# Patient Record
Sex: Female | Born: 1940
Health system: Southern US, Community
[De-identification: ages and names within clinical notes are randomized; demographics above are authoritative.]

## PROBLEM LIST (undated history)

## (undated) DIAGNOSIS — K219 Gastro-esophageal reflux disease without esophagitis: Secondary | ICD-10-CM

## (undated) DIAGNOSIS — I10 Essential (primary) hypertension: Secondary | ICD-10-CM

---

## 2007-11-29 ENCOUNTER — Emergency Department: Payer: Self-pay | Admitting: Emergency Medicine

## 2008-05-07 ENCOUNTER — Ambulatory Visit: Payer: Self-pay | Admitting: Internal Medicine

## 2009-04-02 ENCOUNTER — Emergency Department: Payer: Self-pay

## 2009-06-08 ENCOUNTER — Emergency Department: Payer: Self-pay | Admitting: Emergency Medicine

## 2009-08-20 ENCOUNTER — Emergency Department: Payer: Self-pay | Admitting: Emergency Medicine

## 2009-11-02 ENCOUNTER — Emergency Department: Payer: Self-pay | Admitting: Emergency Medicine

## 2010-12-01 ENCOUNTER — Emergency Department: Payer: Self-pay | Admitting: Emergency Medicine

## 2011-03-19 ENCOUNTER — Ambulatory Visit: Payer: Self-pay | Admitting: Urology

## 2012-02-26 IMAGING — CR LEFT WRIST - COMPLETE 3+ VIEW
1 series · 4 of 4 positions shown · non-contrast
Comparison: none

REASON FOR EXAM: fall, pain
COMMENTS:

PROCEDURE:     DXR - DXR WRIST LT COMP WITH OBLIQUES  - December 01, 2010  [DATE]
RESULT:     There is no evidence of fracture, dislocation, or malalignment.

[Series 1: view not recorded · 0.17mm/px · 4 of 4 slices shown]
[im 1/4]
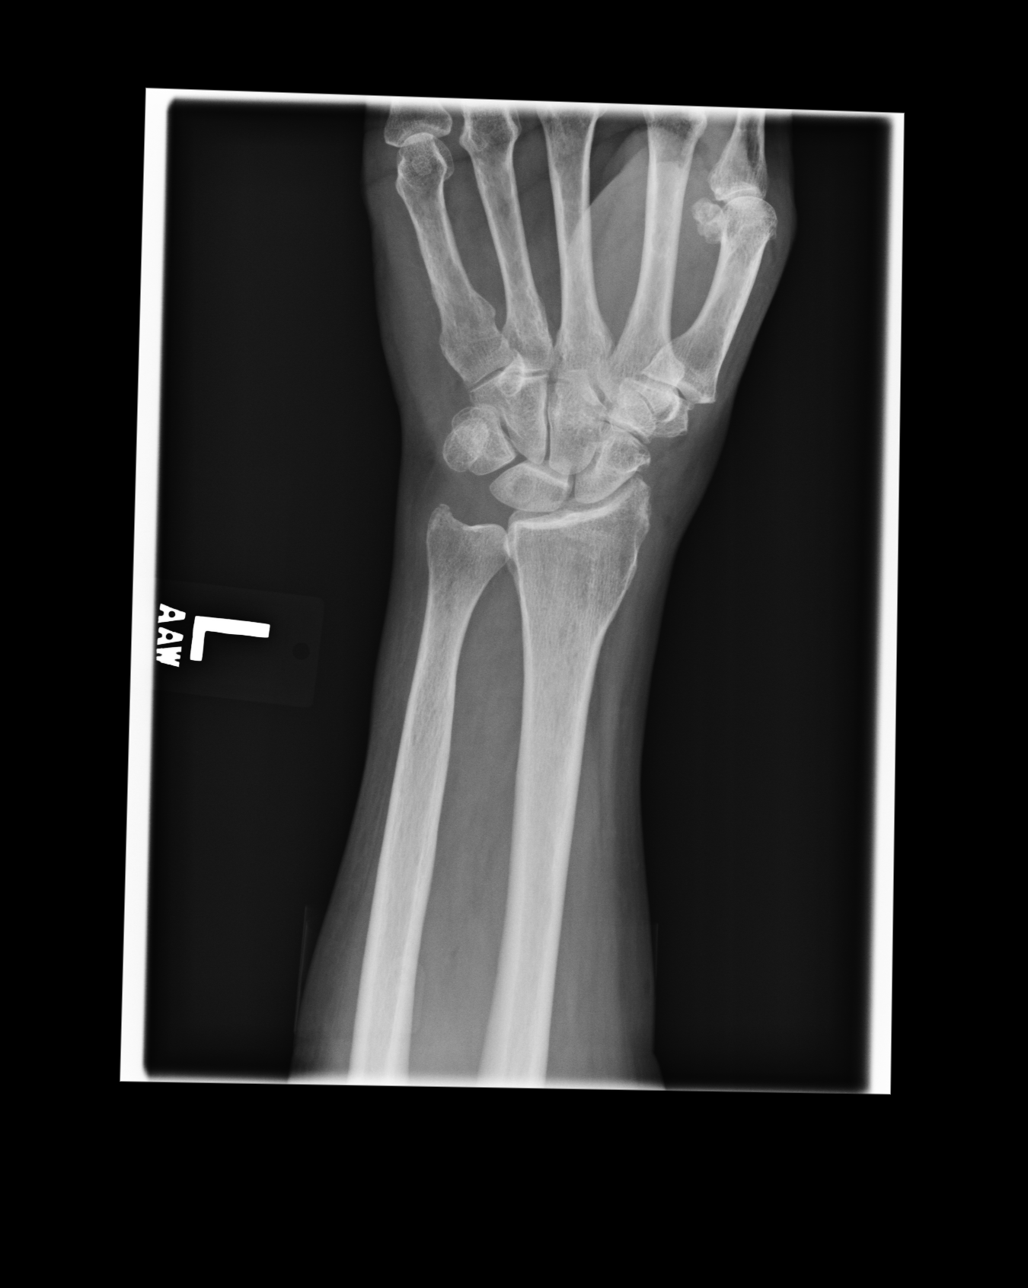
[im 2/4]
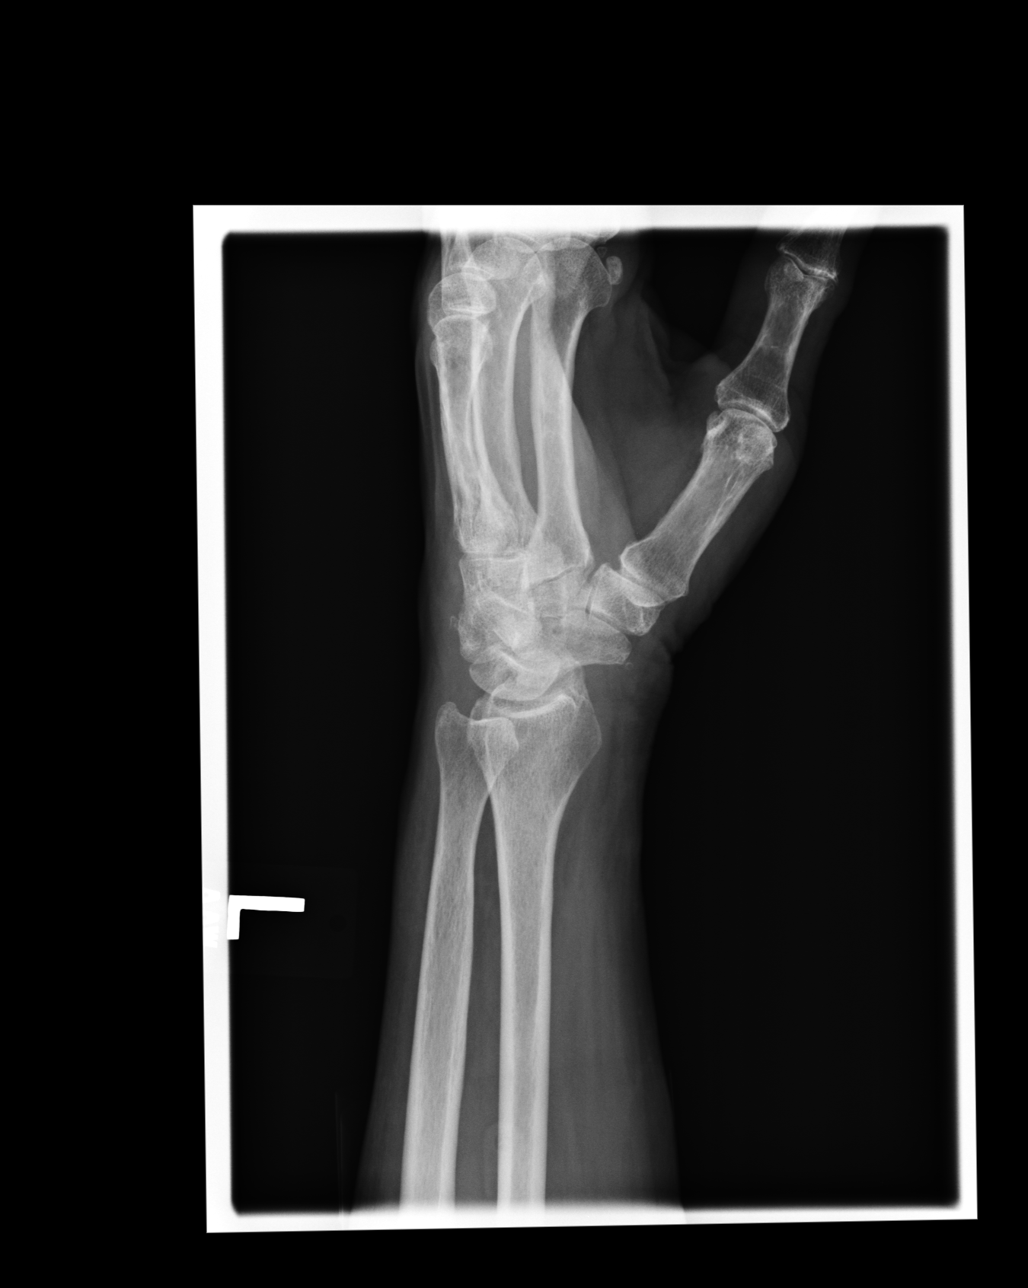
[im 3/4]
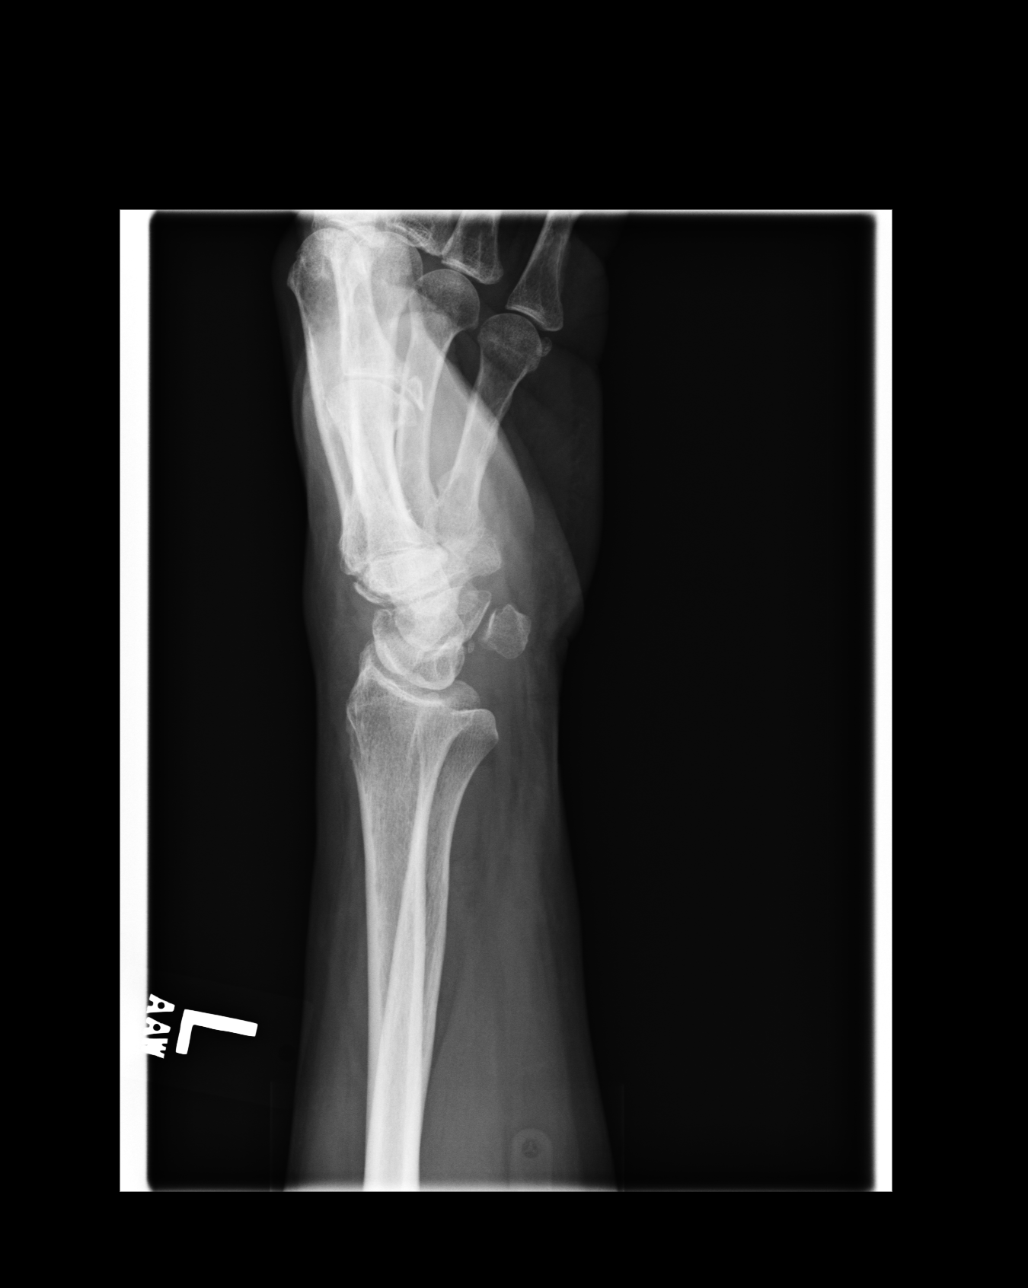
[im 4/4]
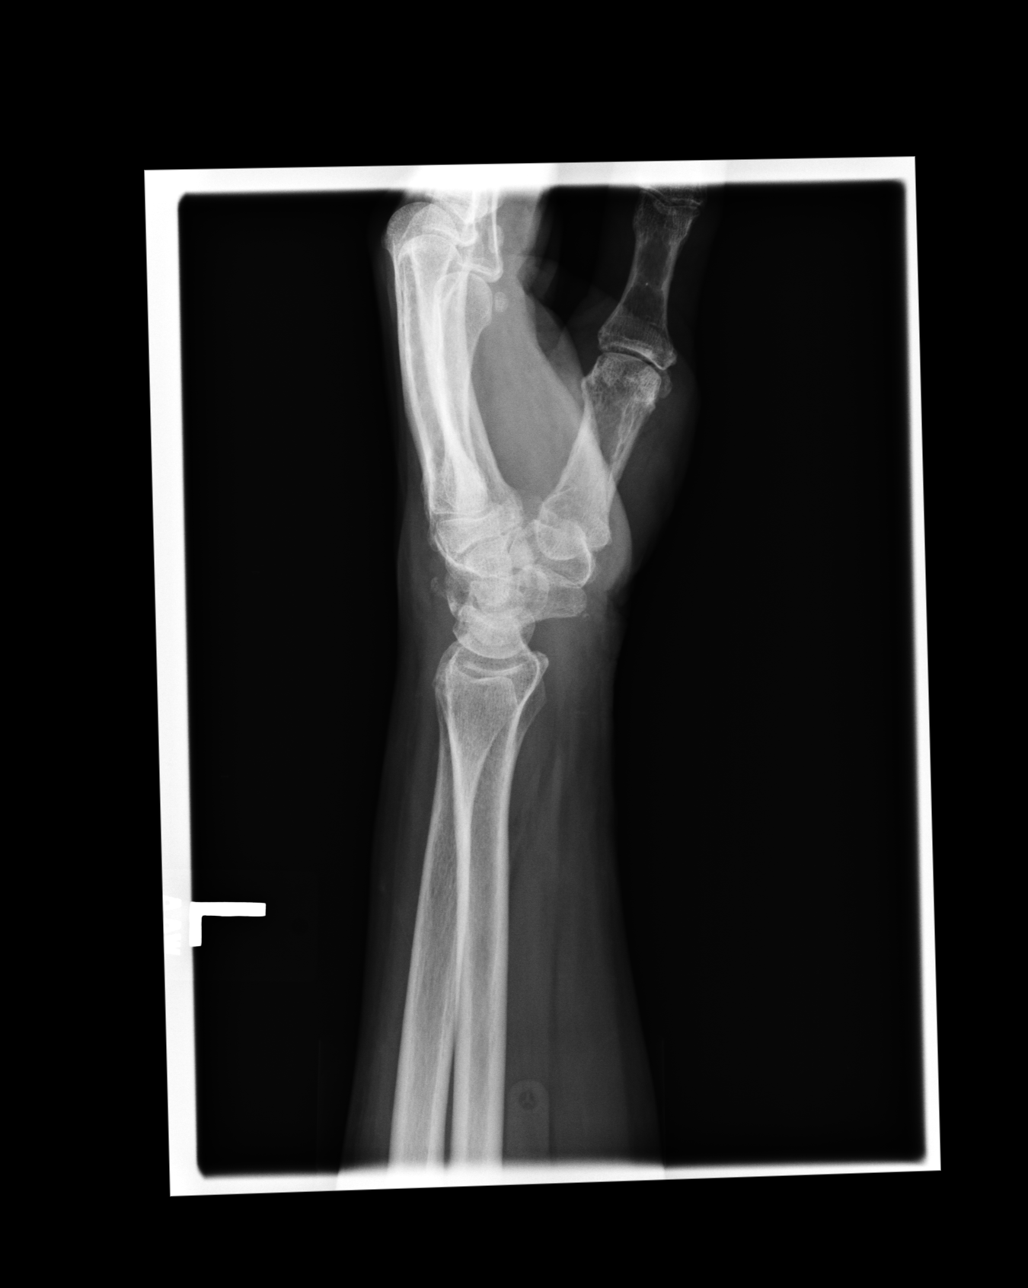

[4 of 4 positions shown; findings below may reference images not displayed]

IMPRESSION: 1. No evidence of acute abnormalities.
2. If there are persistent complaints of pain or persistent clinical
concern, a repeat evaluation in 7-10 days is recommended if clinically
warranted.

## 2013-01-17 ENCOUNTER — Ambulatory Visit: Payer: Self-pay | Admitting: Nurse Practitioner

## 2014-11-12 ENCOUNTER — Other Ambulatory Visit: Payer: Self-pay | Admitting: Nurse Practitioner

## 2014-11-12 DIAGNOSIS — Z1231 Encounter for screening mammogram for malignant neoplasm of breast: Secondary | ICD-10-CM

## 2014-11-25 ENCOUNTER — Ambulatory Visit
Admission: RE | Admit: 2014-11-25 | Discharge: 2014-11-25 | Disposition: A | Discharge status: Home / Self Care | Payer: Medicare Other | Source: Ambulatory Visit | Attending: Nurse Practitioner | Admitting: Nurse Practitioner

## 2014-11-25 ENCOUNTER — Other Ambulatory Visit: Payer: Self-pay | Admitting: Nurse Practitioner

## 2014-11-25 DIAGNOSIS — Z1231 Encounter for screening mammogram for malignant neoplasm of breast: Secondary | ICD-10-CM | POA: Diagnosis not present

## 2016-03-18 ENCOUNTER — Other Ambulatory Visit: Payer: Self-pay | Admitting: Nurse Practitioner

## 2016-03-18 DIAGNOSIS — Z1231 Encounter for screening mammogram for malignant neoplasm of breast: Secondary | ICD-10-CM

## 2016-04-16 ENCOUNTER — Ambulatory Visit: Payer: Medicare Other

## 2016-06-10 ENCOUNTER — Ambulatory Visit
Admission: RE | Admit: 2016-06-10 | Discharge: 2016-06-10 | Disposition: A | Discharge status: Home / Self Care | Payer: Medicare HMO | Source: Ambulatory Visit | Attending: Nurse Practitioner | Admitting: Nurse Practitioner

## 2016-06-10 DIAGNOSIS — Z1231 Encounter for screening mammogram for malignant neoplasm of breast: Secondary | ICD-10-CM | POA: Diagnosis present

## 2017-04-20 ENCOUNTER — Other Ambulatory Visit: Payer: Self-pay | Admitting: Nurse Practitioner

## 2017-04-20 DIAGNOSIS — Z1231 Encounter for screening mammogram for malignant neoplasm of breast: Secondary | ICD-10-CM

## 2017-07-29 ENCOUNTER — Ambulatory Visit
Admission: RE | Admit: 2017-07-29 | Discharge: 2017-07-29 | Disposition: A | Discharge status: Home / Self Care | Payer: Medicare HMO | Source: Ambulatory Visit | Attending: Nurse Practitioner | Admitting: Nurse Practitioner

## 2017-07-29 DIAGNOSIS — Z1231 Encounter for screening mammogram for malignant neoplasm of breast: Secondary | ICD-10-CM | POA: Diagnosis not present

## 2017-09-30 ENCOUNTER — Emergency Department
Admission: EM | Admit: 2017-09-30 | Discharge: 2017-09-30 | Disposition: A | Discharge status: Home / Self Care | Payer: Medicare HMO | Attending: Emergency Medicine | Admitting: Emergency Medicine

## 2017-09-30 ENCOUNTER — Emergency Department: Payer: Medicare HMO

## 2017-09-30 DIAGNOSIS — W1831XA Fall on same level due to stepping on an object, initial encounter: Secondary | ICD-10-CM | POA: Diagnosis not present

## 2017-09-30 DIAGNOSIS — W19XXXA Unspecified fall, initial encounter: Secondary | ICD-10-CM

## 2017-09-30 DIAGNOSIS — I10 Essential (primary) hypertension: Secondary | ICD-10-CM | POA: Insufficient documentation

## 2017-09-30 DIAGNOSIS — Y999 Unspecified external cause status: Secondary | ICD-10-CM | POA: Diagnosis not present

## 2017-09-30 DIAGNOSIS — Z87891 Personal history of nicotine dependence: Secondary | ICD-10-CM | POA: Insufficient documentation

## 2017-09-30 DIAGNOSIS — Y929 Unspecified place or not applicable: Secondary | ICD-10-CM | POA: Insufficient documentation

## 2017-09-30 DIAGNOSIS — M25512 Pain in left shoulder: Secondary | ICD-10-CM | POA: Insufficient documentation

## 2017-09-30 DIAGNOSIS — Y939 Activity, unspecified: Secondary | ICD-10-CM | POA: Insufficient documentation

## 2017-09-30 HISTORY — DX: Essential (primary) hypertension: I10

## 2017-09-30 HISTORY — DX: Gastro-esophageal reflux disease without esophagitis: K21.9

## 2017-09-30 MED ORDER — HYDROCODONE-ACETAMINOPHEN 5-325 MG PO TABS
1.0000 | ORAL_TABLET | Freq: Once | ORAL | Status: AC
  Administered 2017-09-30: 1 via ORAL
  Filled 2017-09-30: qty 1

## 2017-09-30 MED ORDER — HYDROCODONE-ACETAMINOPHEN 5-325 MG PO TABS: 1.0000 | ORAL_TABLET | Freq: Four times a day (QID) | ORAL | 0 refills | Status: DC | PRN

## 2017-09-30 NOTE — ED Provider Notes (Signed)
[facility] Emergency Department Provider Note  ____________________________________________   First MD Initiated Contact with Patient 09/30/17 1645     (approximate)  I have reviewed the triage vital signs and the nursing notes.   HISTORY  Chief Complaint Fall and Shoulder Injury    HPI [name] [name] is a 77 y.o. female complains of falling on her left shoulder earlier today.  She states that she cannot lift her left arm overhead.  States pain is a 10 out of 10.  She has not hit her head.  She did not lose consciousness.  She has had no neurologic deficits since the fall at 1 PM.  She denies any numbness or tingling   Past Medical History:  Diagnosis Date  . GERD (gastroesophageal reflux disease)   . Hypertension     There are no active problems to display for this patient.   History reviewed. No pertinent surgical history.  Prior to Admission medications   Medication Sig Start Date End Date Taking? Authorizing Provider  HYDROcodone-acetaminophen (NORCO/VICODIN) 5-325 MG tablet Take 1 tablet by mouth every 6 (six) hours as needed for moderate pain. 09/30/17   Faythe GheeFisher, [employer] W, PA-C    Allergies Patient has no known allergies.  Family History  Problem Relation Age of Onset  . Breast cancer Neg Hx     Social History Social History   Tobacco Use  . Smoking status: Former Games developermoker  . Smokeless tobacco: Never Used  Substance Use Topics  . Alcohol use: Yes    Comment: occ  . Drug use: Not Currently    Review of Systems   Constitutional: No fever/chills Eyes: No visual changes. ENT: No sore throat. Respiratory: Denies cough Genitourinary: Negative for dysuria. Musculoskeletal: Negative for back pain.  Positive for left shoulder pain Skin: Negative for rash.    ____________________________________________   PHYSICAL EXAM:  VITAL SIGNS: ED Triage Vitals  Enc Vitals Group     BP 09/30/17 1615 (!) 118/96     Pulse Rate  09/30/17 1615 92     Resp 09/30/17 1615 18     Temp 09/30/17 1615 98.3 F (36.8 C)     Temp Source 09/30/17 1615 Oral     SpO2 09/30/17 1615 97 %     Weight 09/30/17 1618 170 lb (77.1 kg)     Height --      Head Circumference --      Peak Flow --      Pain Score 09/30/17 1617 10     Pain Loc --      Pain Edu? --      Excl. in GC? --     Constitutional: Alert and oriented. Well appearing and in no acute distress. Eyes: Conjunctivae are normal.  Head: Atraumatic. Nose: No congestion/rhinnorhea. Mouth/Throat: Mucous membranes are moist.   Cardiovascular: Normal rate, regular rhythm.  Sounds are normal Respiratory: Normal respiratory effort.  No retractions, lungs clear to auscultation GU: deferred Musculoskeletal: The left shoulder is tender and swollen at the proximal humerus and distal clavicle.  The pain is reproduced with movement of the left arm.  She is neurovascularly intact.  C-spine is nontender.  Wrist and elbows are nontender. Neurologic:  Normal speech and language.  Skin:  Skin is warm, dry and intact. No rash noted. Psychiatric: Mood and affect are normal. Speech and behavior are normal.  ____________________________________________   LABS (all labs ordered are listed, but only abnormal results are displayed)  Labs Reviewed - No  data to display ____________________________________________   ____________________________________________  RADIOLOGY  X-ray of the left shoulder is negative for any acute fractures  ____________________________________________   PROCEDURES  Procedure(s) performed: Sling was applied by the tech  Procedures    ____________________________________________   INITIAL IMPRESSION / ASSESSMENT AND PLAN / ED COURSE  Pertinent labs & imaging results that were available during my care of the patient were reviewed by me and considered in my medical decision making (see chart for details).  Patient is a 77 year old female that  fell on her left shoulder earlier today.  She is complaining pain in the left shoulder.  No loss consciousness.  No headache.  On physical exam left shoulder is tender and swollen.  X-ray left shoulder is negative for any acute fracture  A sling was applied by the tech.  X-ray results were discussed with patient and her family.  She was given a prescription for Norco 5/325 #15 no refill.  She is to follow-up with her regular doctor who she Artie has an appointment with on Monday.  If she is worsening she should return the emergency department.  She has any headaches or neurologic changes she should return to the emergency department as soon as possible.  Patient and her family both state they understand.  She was discharged in stable condition     As part of my medical decision making, I reviewed the following data within the electronic MEDICAL RECORD NUMBER History obtained from family, Nursing notes reviewed and incorporated, Radiograph reviewed x-ray of the left shoulder is negative, Notes from prior ED visits and [place] Controlled Substance Database  ____________________________________________   FINAL CLINICAL IMPRESSION(S) / ED DIAGNOSES  Final diagnoses:  Fall, initial encounter  Acute pain of left shoulder      NEW MEDICATIONS STARTED DURING THIS VISIT:  Discharge Medication List as of 09/30/2017  5:17 PM    START taking these medications   Details  HYDROcodone-acetaminophen (NORCO/VICODIN) 5-325 MG tablet Take 1 tablet by mouth every 6 (six) hours as needed for moderate pain., Starting Fri 09/30/2017, Print         Note:  This document was prepared using Dragon voice recognition software and may include unintentional dictation errors.    Faythe Ghee, PA-C 09/30/17 1828    Jeanmarie Plant, MD 09/30/17 [phone]

## 2017-09-30 NOTE — ED Triage Notes (Signed)
Pt reports babysitting grandchild and she fell over toy and fell on L shoulder.  Pt states that this happened at about 130pm.  Pt is A&Ox4.  Pt denies taking anything for pain PTA.

## 2017-09-30 NOTE — ED Notes (Signed)
Pt reports tripping over a toy earlier today and landing on her left shoulder. Pt does not having any mobility in her left arm and states her pain is a 10 on 1-10 scale.

## 2017-09-30 NOTE — Discharge Instructions (Signed)
Follow-up with your regular doctor if you are not better in 3-5 days.  He can also see orthopedics with Dr.Poggi.  He would need to call and make an appointment in his office.  Apply ice to the affected area.  He can take over-the-counter Tylenol or ibuprofen as needed.  He had a prescription for Norco which will help with pain not relieved by the over-the-counter medication.

## 2018-12-26 IMAGING — CR DG SHOULDER 2+V*L*
3 series · 3 of 3 positions shown · non-contrast
Comparison: None.

CLINICAL DATA: Fall, pain.

EXAM:
LEFT SHOULDER - 2+ VIEW

[shoulder grashey]
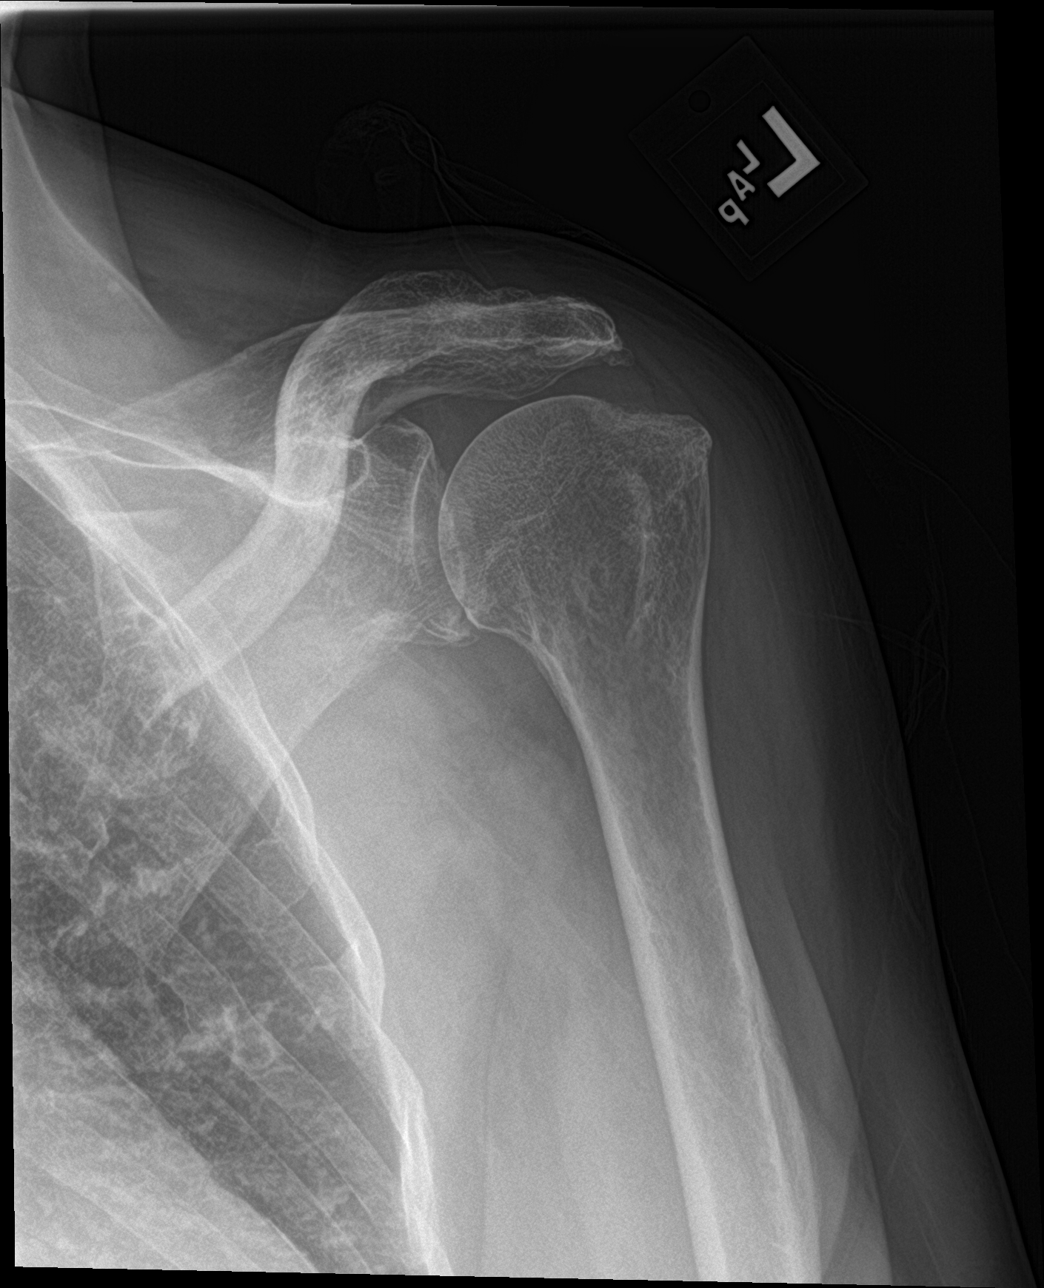

[shoulder y view]
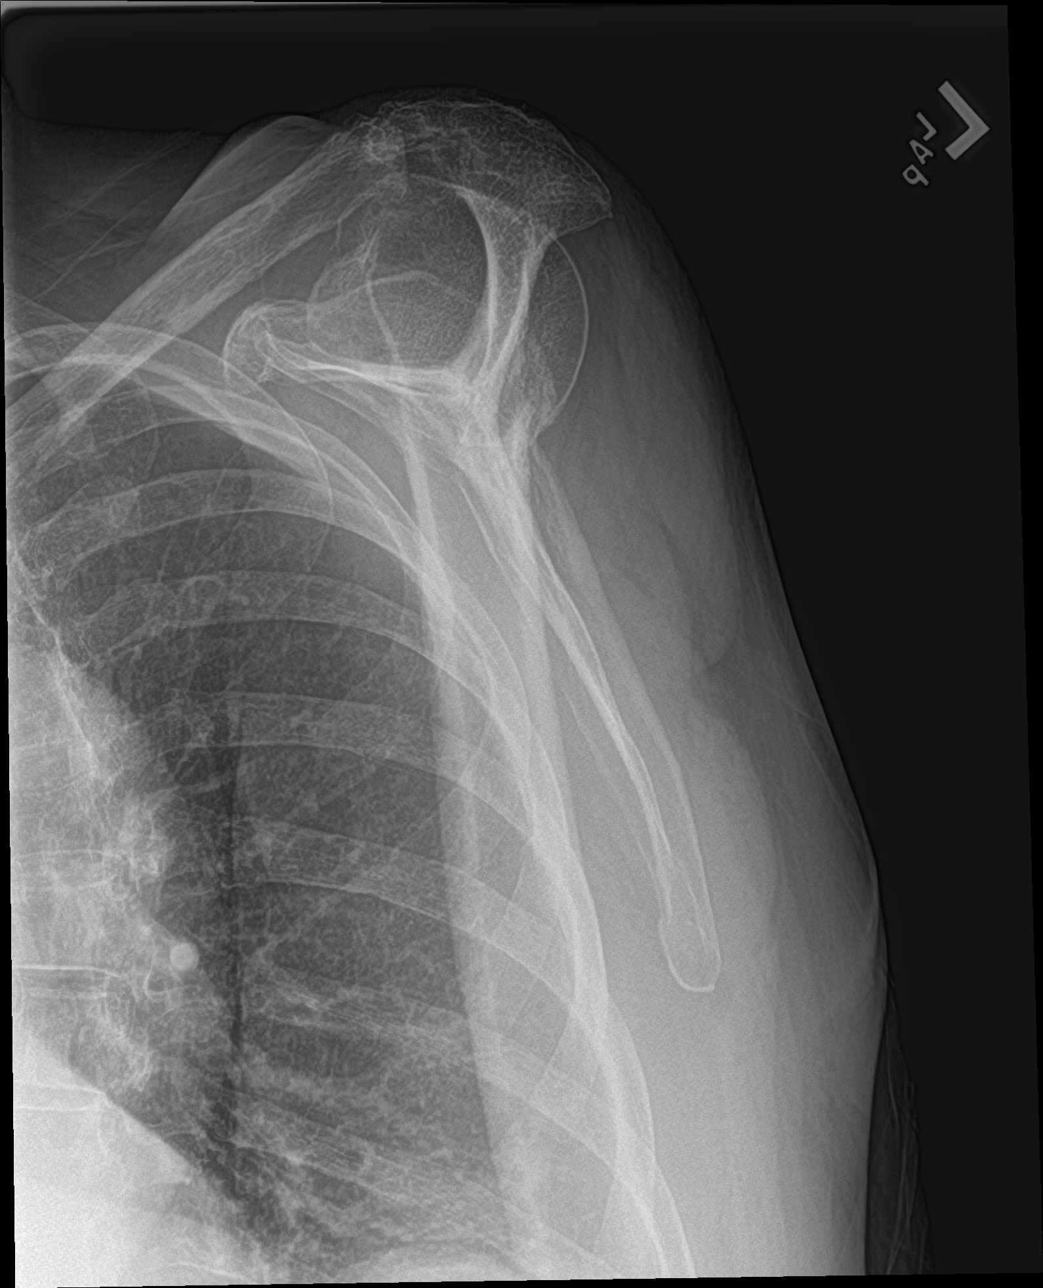

[shoulder axillary]
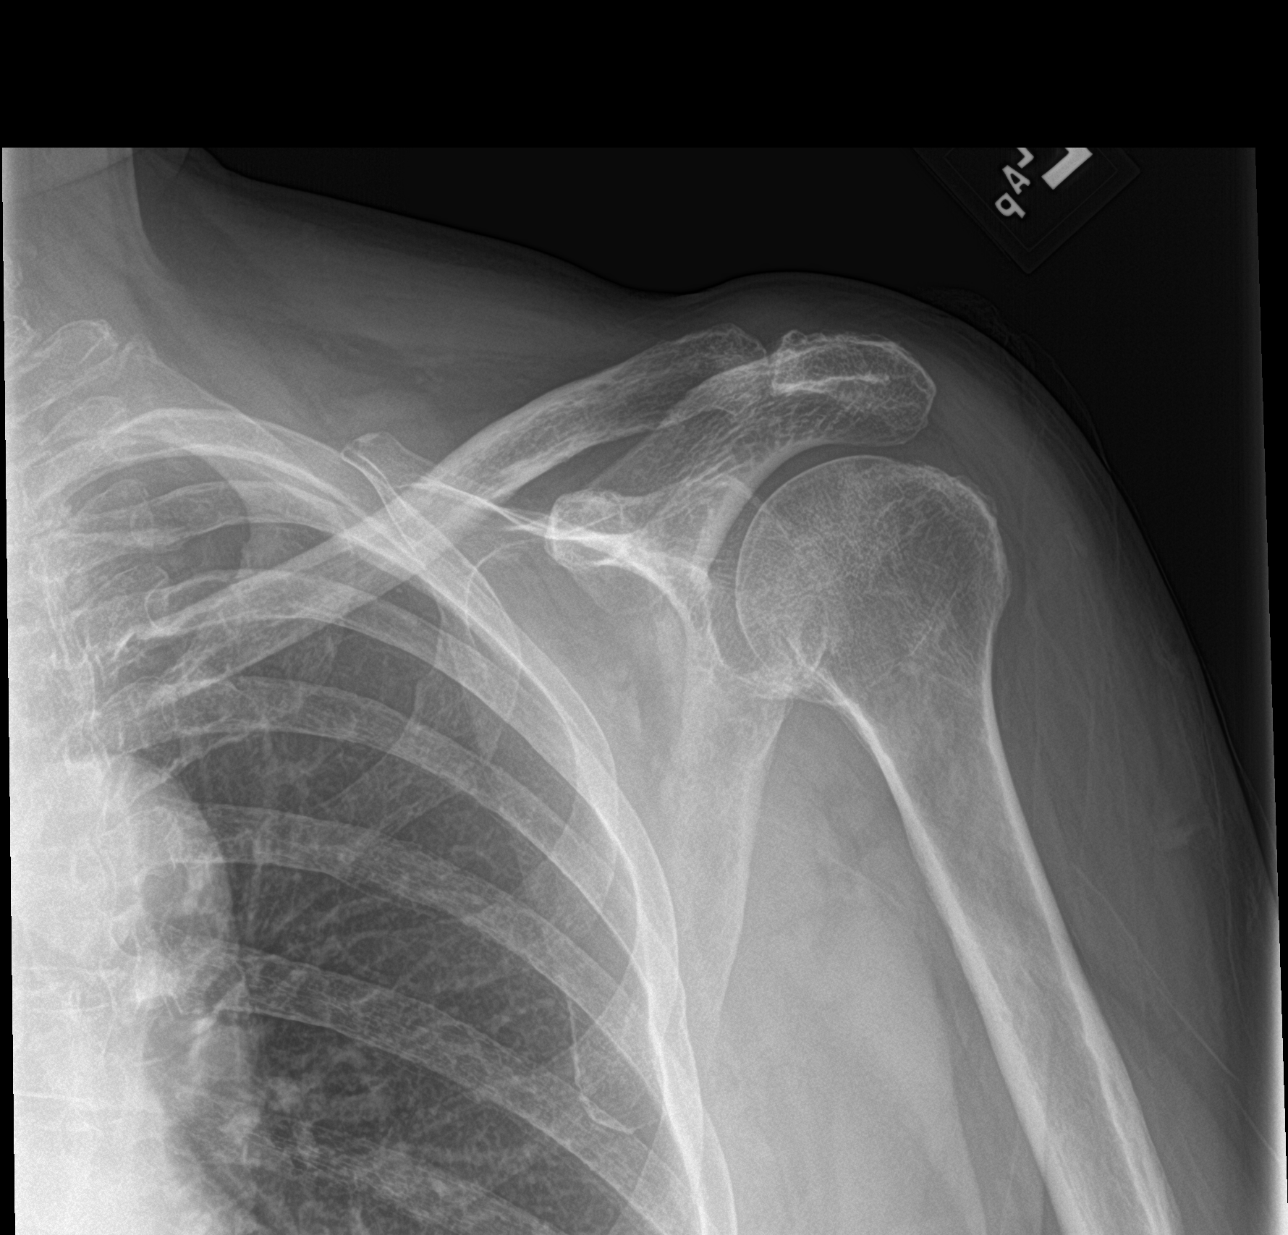

[3 of 3 positions shown; findings below may reference images not displayed]

FINDINGS: The patient could not extend the arm, therefore an axillary view was
not obtained. There is no evidence of fracture or dislocation. There
is no evidence of arthropathy or other focal bone abnormality. Soft
tissues are unremarkable.
IMPRESSION: Negative.

## 2018-12-27 ENCOUNTER — Other Ambulatory Visit: Payer: Self-pay | Admitting: Nurse Practitioner

## 2018-12-27 DIAGNOSIS — Z1231 Encounter for screening mammogram for malignant neoplasm of breast: Secondary | ICD-10-CM

## 2020-08-26 DIAGNOSIS — I10 Essential (primary) hypertension: Secondary | ICD-10-CM | POA: Diagnosis not present

## 2020-08-26 DIAGNOSIS — R5383 Other fatigue: Secondary | ICD-10-CM | POA: Diagnosis not present

## 2020-08-26 DIAGNOSIS — K219 Gastro-esophageal reflux disease without esophagitis: Secondary | ICD-10-CM | POA: Diagnosis not present

## 2020-08-26 DIAGNOSIS — E785 Hyperlipidemia, unspecified: Secondary | ICD-10-CM | POA: Diagnosis not present

## 2020-08-26 DIAGNOSIS — R7301 Impaired fasting glucose: Secondary | ICD-10-CM | POA: Diagnosis not present

## 2020-08-26 DIAGNOSIS — H6123 Impacted cerumen, bilateral: Secondary | ICD-10-CM | POA: Diagnosis not present

## 2020-08-28 DIAGNOSIS — R945 Abnormal results of liver function studies: Secondary | ICD-10-CM | POA: Diagnosis not present

## 2020-09-03 DIAGNOSIS — R945 Abnormal results of liver function studies: Secondary | ICD-10-CM | POA: Diagnosis not present

## 2020-10-28 ENCOUNTER — Ambulatory Visit: Payer: Medicare HMO | Admitting: Podiatry

## 2020-10-28 ENCOUNTER — Other Ambulatory Visit: Payer: Self-pay

## 2020-10-28 ENCOUNTER — Encounter: Payer: Self-pay | Admitting: Podiatry

## 2020-10-28 DIAGNOSIS — B353 Tinea pedis: Secondary | ICD-10-CM | POA: Diagnosis not present

## 2020-10-28 MED ORDER — CLOTRIMAZOLE-BETAMETHASONE 1-0.05 % EX CREA: 1.0000 "application " | TOPICAL_CREAM | Freq: Two times a day (BID) | CUTANEOUS | 0 refills | Status: DC

## 2020-10-28 NOTE — Progress Notes (Signed)
  Subjective:  Patient ID: [name] [name], female    DOB: 1940-07-04,  MRN: [id number]  Chief Complaint  Patient presents with  . Nail Problem    Patient presents today for ingrown toenail, Willetts thick nail hard to clip right hallux and left itchy feet    80 y.o. female presents with the above complaint.  Patient presents with complaint of left foot itching that has been going on for quite some time.  Patient states she had to do a lot of itching.  She goes barefooted everywhere.  She may have stepped in something that could have led to a fungal infection.  She would like to discuss treatment options for it she is got mild epidermal lysis.  She states is feeling little bit better now.  She has not tried any treatment options for it.  She denies any other acute complaints.   Review of Systems: Negative except as noted in the HPI. Denies N/V/F/Ch.  Past Medical History:  Diagnosis Date  . GERD (gastroesophageal reflux disease)   . Hypertension     Current Outpatient Medications:  .  Cholecalciferol (VITAMIN D3) 25 MCG (1000 UT) CAPS, Take by mouth., Disp: , Rfl:  .  clotrimazole-betamethasone (LOTRISONE) cream, Apply 1 application topically 2 (two) times daily., Disp: 30 g, Rfl: 0 .  amLODipine (NORVASC) 5 MG tablet, Take 1 tablet by mouth daily., Disp: , Rfl:  .  chlorthalidone (HYGROTON) 25 MG tablet, Take 25 mg by mouth daily., Disp: , Rfl:  .  HYDROcodone-acetaminophen (NORCO/VICODIN) 5-325 MG tablet, Take 1 tablet by mouth every 6 (six) hours as needed for moderate pain., Disp: 15 tablet, Rfl: 0 .  KLOR-CON M20 20 MEQ tablet, Take 20 mEq by mouth daily., Disp: , Rfl:  .  NAMZARIC 28-10 MG CP24, Take 1 capsule by mouth daily., Disp: , Rfl:  .  omeprazole (PRILOSEC) 40 MG capsule, Take 1 capsule by mouth daily., Disp: , Rfl:   Social History   Tobacco Use  Smoking Status Former Smoker  Smokeless Tobacco Never Used    No Known Allergies Objective:  There were no vitals filed for  this visit. There is no height or weight on file to calculate BMI. Constitutional Well developed. Well nourished.  Vascular Dorsalis pedis pulses palpable bilaterally. Posterior tibial pulses palpable bilaterally. Capillary refill normal to all digits.  No cyanosis or clubbing noted. Pedal hair growth normal.  Neurologic Normal speech. Oriented to person, place, and time. Epicritic sensation to light touch grossly present bilaterally.  Dermatologic  thickened elongated dystrophic toenails x10.  Mild pain on palpation.  Mycotic nature to it.  Left plantar foot with epidermal lysis and subjective complaint Hx consistent with athlete's foot.  Orthopedic: Normal joint ROM without pain or crepitus bilaterally. No visible deformities. No bony tenderness.   Radiographs: None Assessment:   1. Athlete's foot, left    Plan:  Patient was evaluated and treated and all questions answered.  Left foot athlete's foot -I explained to the patient the etiology of athlete's foot and various treatment options were discussed.  Given the amount of itching that she is experiencing I believe she will benefit from Lotrisone cream.  At this time her itching has calmed down.  She does have mild epidermal lysis still left.  I have asked her to apply the Lotrisone cream twice a day.  She states understanding will do so immediately.  Return in about 3 months (around 01/28/2021) for Routine Foot Care.

## 2020-11-13 ENCOUNTER — Other Ambulatory Visit: Payer: Self-pay | Admitting: Podiatry

## 2020-12-03 DIAGNOSIS — I10 Essential (primary) hypertension: Secondary | ICD-10-CM | POA: Diagnosis not present

## 2020-12-03 DIAGNOSIS — K219 Gastro-esophageal reflux disease without esophagitis: Secondary | ICD-10-CM | POA: Diagnosis not present

## 2020-12-03 DIAGNOSIS — R7303 Prediabetes: Secondary | ICD-10-CM | POA: Diagnosis not present

## 2020-12-03 DIAGNOSIS — R5383 Other fatigue: Secondary | ICD-10-CM | POA: Diagnosis not present

## 2020-12-03 DIAGNOSIS — R7301 Impaired fasting glucose: Secondary | ICD-10-CM | POA: Diagnosis not present

## 2020-12-03 DIAGNOSIS — R69 Illness, unspecified: Secondary | ICD-10-CM | POA: Diagnosis not present

## 2020-12-03 DIAGNOSIS — E785 Hyperlipidemia, unspecified: Secondary | ICD-10-CM | POA: Diagnosis not present

## 2020-12-17 DIAGNOSIS — R69 Illness, unspecified: Secondary | ICD-10-CM | POA: Diagnosis not present

## 2020-12-17 DIAGNOSIS — Z87891 Personal history of nicotine dependence: Secondary | ICD-10-CM | POA: Diagnosis not present

## 2020-12-17 DIAGNOSIS — K219 Gastro-esophageal reflux disease without esophagitis: Secondary | ICD-10-CM | POA: Diagnosis not present

## 2020-12-17 DIAGNOSIS — I10 Essential (primary) hypertension: Secondary | ICD-10-CM | POA: Diagnosis not present

## 2020-12-17 DIAGNOSIS — R32 Unspecified urinary incontinence: Secondary | ICD-10-CM | POA: Diagnosis not present

## 2021-01-13 ENCOUNTER — Encounter: Payer: Self-pay | Admitting: Podiatry

## 2021-01-13 ENCOUNTER — Other Ambulatory Visit: Payer: Self-pay

## 2021-01-13 ENCOUNTER — Ambulatory Visit (INDEPENDENT_AMBULATORY_CARE_PROVIDER_SITE_OTHER): Payer: Medicare HMO | Admitting: Podiatry

## 2021-01-13 DIAGNOSIS — B353 Tinea pedis: Secondary | ICD-10-CM | POA: Diagnosis not present

## 2021-01-13 MED ORDER — CLOTRIMAZOLE-BETAMETHASONE 1-0.05 % EX CREA: 1.0000 "application " | TOPICAL_CREAM | Freq: Two times a day (BID) | CUTANEOUS | 5 refills | Status: DC

## 2021-01-13 NOTE — Progress Notes (Signed)
ot

## 2021-01-13 NOTE — Progress Notes (Signed)
Subjective:  Patient ID: [name] [name], female    DOB: 12-06-1940,  MRN: [id number]  Chief Complaint  Patient presents with   Foot Pain    Bilateral itching and burning and soreness of both feet     80 y.o. female presents with the above complaint.  Patient presents with complaint of left athlete's foot which has now spread to the right side.  Patient states that she does go barefooted.  She has tried doing more shoes now.  She has been applying Lotrisone cream which seem to help considerably.  She however scratches her left side with the right foot which may have caused the scarring.  I encouraged her to stop doing that.  She denies any other acute complaint she would like to do a refill of Lotrisone cream.   Review of Systems: Negative except as noted in the HPI. Denies N/V/F/Ch.  Past Medical History:  Diagnosis Date   GERD (gastroesophageal reflux disease)    Hypertension     Current Outpatient Medications:    clotrimazole-betamethasone (LOTRISONE) cream, Apply 1 application topically 2 (two) times daily., Disp: 45 g, Rfl: 5   amLODipine (NORVASC) 5 MG tablet, Take 1 tablet by mouth daily., Disp: , Rfl:    chlorthalidone (HYGROTON) 25 MG tablet, Take 25 mg by mouth daily., Disp: , Rfl:    Cholecalciferol (VITAMIN D3) 25 MCG (1000 UT) CAPS, Take by mouth., Disp: , Rfl:    clotrimazole-betamethasone (LOTRISONE) cream, APPLY TOPICALLY TO THE AFFECTED AREA TWICE DAILY, Disp: 30 g, Rfl: 0   HYDROcodone-acetaminophen (NORCO/VICODIN) 5-325 MG tablet, Take 1 tablet by mouth every 6 (six) hours as needed for moderate pain., Disp: 15 tablet, Rfl: 0   KLOR-CON M20 20 MEQ tablet, Take 20 mEq by mouth daily., Disp: , Rfl:    NAMZARIC 28-10 MG CP24, Take 1 capsule by mouth daily., Disp: , Rfl:    omeprazole (PRILOSEC) 40 MG capsule, Take 1 capsule by mouth daily., Disp: , Rfl:   Social History   Tobacco Use  Smoking Status Former  Smokeless Tobacco Never    No Known Allergies Objective:   There were no vitals filed for this visit. There is no height or weight on file to calculate BMI. Constitutional Well developed. Well nourished.  Vascular Dorsalis pedis pulses palpable bilaterally. Posterior tibial pulses palpable bilaterally. Capillary refill normal to all digits.  No cyanosis or clubbing noted. Pedal hair growth normal.  Neurologic Normal speech. Oriented to person, place, and time. Epicritic sensation to light touch grossly present bilaterally.  Dermatologic  thickened elongated dystrophic toenails x10.  Mild pain on palpation.  Mycotic nature to it.  Improving left plantar foot with epidermal lysis and subjective complaint Hx consistent with athlete's foot.  Same findings on the right side.  Orthopedic: Normal joint ROM without pain or crepitus bilaterally. No visible deformities. No bony tenderness.   Radiographs: None Assessment:   1. Tinea pedis of both feet     Plan:  Patient was evaluated and treated and all questions answered.  Bilateral foot athlete's foot -I explained to the patient the etiology of athlete's foot and various treatment options were discussed.  Given the amount of itching that she is experiencing I believe she will benefit from Lotrisone cream.  At this time her itching has calmed down. -Refill for Lotrisone cream has been sent for both feet now.  I encouraged her to apply twice a day.  She states understanding will do so immediately.  No follow-ups on file.

## 2021-01-29 ENCOUNTER — Ambulatory Visit: Payer: Medicare HMO | Admitting: Podiatry

## 2021-02-05 DIAGNOSIS — E785 Hyperlipidemia, unspecified: Secondary | ICD-10-CM | POA: Diagnosis not present

## 2021-02-05 DIAGNOSIS — M1990 Unspecified osteoarthritis, unspecified site: Secondary | ICD-10-CM | POA: Diagnosis not present

## 2021-02-05 DIAGNOSIS — I10 Essential (primary) hypertension: Secondary | ICD-10-CM | POA: Diagnosis not present

## 2021-02-05 DIAGNOSIS — M25562 Pain in left knee: Secondary | ICD-10-CM | POA: Diagnosis not present

## 2021-02-16 DIAGNOSIS — R051 Acute cough: Secondary | ICD-10-CM | POA: Diagnosis not present

## 2021-02-16 DIAGNOSIS — J189 Pneumonia, unspecified organism: Secondary | ICD-10-CM | POA: Diagnosis not present

## 2021-02-16 DIAGNOSIS — R42 Dizziness and giddiness: Secondary | ICD-10-CM | POA: Diagnosis not present

## 2021-02-18 DIAGNOSIS — J159 Unspecified bacterial pneumonia: Secondary | ICD-10-CM | POA: Diagnosis not present

## 2021-02-18 DIAGNOSIS — I10 Essential (primary) hypertension: Secondary | ICD-10-CM | POA: Diagnosis not present

## 2021-02-18 DIAGNOSIS — R69 Illness, unspecified: Secondary | ICD-10-CM | POA: Diagnosis not present

## 2021-02-18 DIAGNOSIS — M1990 Unspecified osteoarthritis, unspecified site: Secondary | ICD-10-CM | POA: Diagnosis not present

## 2021-02-26 DIAGNOSIS — R7301 Impaired fasting glucose: Secondary | ICD-10-CM | POA: Diagnosis not present

## 2021-02-26 DIAGNOSIS — I10 Essential (primary) hypertension: Secondary | ICD-10-CM | POA: Diagnosis not present

## 2021-02-26 DIAGNOSIS — J159 Unspecified bacterial pneumonia: Secondary | ICD-10-CM | POA: Diagnosis not present

## 2021-02-26 DIAGNOSIS — E785 Hyperlipidemia, unspecified: Secondary | ICD-10-CM | POA: Diagnosis not present

## 2021-02-26 DIAGNOSIS — M1990 Unspecified osteoarthritis, unspecified site: Secondary | ICD-10-CM | POA: Diagnosis not present

## 2021-02-26 DIAGNOSIS — R69 Illness, unspecified: Secondary | ICD-10-CM | POA: Diagnosis not present

## 2021-03-16 ENCOUNTER — Ambulatory Visit: Payer: Medicare HMO | Admitting: Podiatry

## 2021-03-19 ENCOUNTER — Ambulatory Visit: Payer: Medicare HMO | Admitting: Podiatry

## 2021-03-19 DIAGNOSIS — R7303 Prediabetes: Secondary | ICD-10-CM | POA: Diagnosis not present

## 2021-03-19 DIAGNOSIS — E785 Hyperlipidemia, unspecified: Secondary | ICD-10-CM | POA: Diagnosis not present

## 2021-03-19 DIAGNOSIS — M25551 Pain in right hip: Secondary | ICD-10-CM | POA: Diagnosis not present

## 2021-03-19 DIAGNOSIS — H6123 Impacted cerumen, bilateral: Secondary | ICD-10-CM | POA: Diagnosis not present

## 2021-03-19 DIAGNOSIS — Z23 Encounter for immunization: Secondary | ICD-10-CM | POA: Diagnosis not present

## 2021-03-24 DIAGNOSIS — M25551 Pain in right hip: Secondary | ICD-10-CM | POA: Diagnosis not present

## 2021-07-07 DIAGNOSIS — M1712 Unilateral primary osteoarthritis, left knee: Secondary | ICD-10-CM | POA: Diagnosis not present

## 2021-07-07 DIAGNOSIS — E785 Hyperlipidemia, unspecified: Secondary | ICD-10-CM | POA: Diagnosis not present

## 2021-07-07 DIAGNOSIS — I10 Essential (primary) hypertension: Secondary | ICD-10-CM | POA: Diagnosis not present

## 2021-07-07 DIAGNOSIS — G621 Alcoholic polyneuropathy: Secondary | ICD-10-CM | POA: Diagnosis not present

## 2021-07-07 DIAGNOSIS — R7303 Prediabetes: Secondary | ICD-10-CM | POA: Diagnosis not present

## 2021-07-15 DIAGNOSIS — E876 Hypokalemia: Secondary | ICD-10-CM | POA: Diagnosis not present

## 2021-07-17 DIAGNOSIS — M25562 Pain in left knee: Secondary | ICD-10-CM | POA: Diagnosis not present

## 2021-07-17 DIAGNOSIS — M1712 Unilateral primary osteoarthritis, left knee: Secondary | ICD-10-CM | POA: Diagnosis not present

## 2021-07-17 DIAGNOSIS — M25462 Effusion, left knee: Secondary | ICD-10-CM | POA: Diagnosis not present

## 2021-10-19 DIAGNOSIS — E785 Hyperlipidemia, unspecified: Secondary | ICD-10-CM | POA: Diagnosis not present

## 2021-10-19 DIAGNOSIS — M25512 Pain in left shoulder: Secondary | ICD-10-CM | POA: Diagnosis not present

## 2021-10-19 DIAGNOSIS — F0390 Unspecified dementia without behavioral disturbance: Secondary | ICD-10-CM | POA: Diagnosis not present

## 2021-10-19 DIAGNOSIS — I10 Essential (primary) hypertension: Secondary | ICD-10-CM | POA: Diagnosis not present

## 2021-10-19 DIAGNOSIS — R7303 Prediabetes: Secondary | ICD-10-CM | POA: Diagnosis not present

## 2021-11-18 DIAGNOSIS — M1712 Unilateral primary osteoarthritis, left knee: Secondary | ICD-10-CM | POA: Diagnosis not present

## 2021-12-31 ENCOUNTER — Telehealth: Payer: Self-pay | Admitting: *Deleted

## 2022-02-09 DIAGNOSIS — R7303 Prediabetes: Secondary | ICD-10-CM | POA: Diagnosis not present

## 2022-02-09 DIAGNOSIS — M25512 Pain in left shoulder: Secondary | ICD-10-CM | POA: Diagnosis not present

## 2022-02-09 DIAGNOSIS — I10 Essential (primary) hypertension: Secondary | ICD-10-CM | POA: Diagnosis not present

## 2022-02-09 DIAGNOSIS — E1165 Type 2 diabetes mellitus with hyperglycemia: Secondary | ICD-10-CM | POA: Diagnosis not present

## 2022-02-09 DIAGNOSIS — E785 Hyperlipidemia, unspecified: Secondary | ICD-10-CM | POA: Diagnosis not present

## 2022-03-03 DIAGNOSIS — M1712 Unilateral primary osteoarthritis, left knee: Secondary | ICD-10-CM | POA: Diagnosis not present

## 2022-03-20 DIAGNOSIS — E785 Hyperlipidemia, unspecified: Secondary | ICD-10-CM | POA: Diagnosis not present

## 2022-03-20 DIAGNOSIS — I10 Essential (primary) hypertension: Secondary | ICD-10-CM | POA: Diagnosis not present

## 2022-03-26 NOTE — Telephone Encounter (Signed)
error 

## 2022-04-05 DIAGNOSIS — M1712 Unilateral primary osteoarthritis, left knee: Secondary | ICD-10-CM | POA: Diagnosis not present

## 2022-05-26 DIAGNOSIS — R5383 Other fatigue: Secondary | ICD-10-CM | POA: Diagnosis not present

## 2022-05-26 DIAGNOSIS — Z0001 Encounter for general adult medical examination with abnormal findings: Secondary | ICD-10-CM | POA: Diagnosis not present

## 2022-05-26 DIAGNOSIS — R7303 Prediabetes: Secondary | ICD-10-CM | POA: Diagnosis not present

## 2022-05-26 DIAGNOSIS — E1165 Type 2 diabetes mellitus with hyperglycemia: Secondary | ICD-10-CM | POA: Diagnosis not present

## 2022-05-26 DIAGNOSIS — I10 Essential (primary) hypertension: Secondary | ICD-10-CM | POA: Diagnosis not present

## 2022-05-26 DIAGNOSIS — E785 Hyperlipidemia, unspecified: Secondary | ICD-10-CM | POA: Diagnosis not present

## 2022-06-19 DIAGNOSIS — E785 Hyperlipidemia, unspecified: Secondary | ICD-10-CM | POA: Diagnosis not present

## 2022-06-19 DIAGNOSIS — I10 Essential (primary) hypertension: Secondary | ICD-10-CM | POA: Diagnosis not present

## 2022-08-25 ENCOUNTER — Telehealth: Payer: Self-pay

## 2022-08-25 NOTE — Telephone Encounter (Signed)
HTN Review Call  BP #1 reading (last): 120/80 on: 05/26/2022 BP #2 reading: 112/70 on: 02/09/2022 BP #3 reading: 134/68 on: 10/19/2021 Any of the last 3 BP > 140/90 mmHg?: No What recent interventions have been made by any provider to improve the patient's conditions in the last 3 months?: Office Visit: 06/25/22 (Not A Actual Visit) [facility] ANP-C STARTED Tamiflu 75 mg 1 capsule by mouth twice daily x 5 days Has there been any documented recent hospitalizations or ED visits since last visit with Clinical Lead?: No Adherence Review Does the [facility] have access to medication refill data?: Yes Adherence rates for STAR metric medications: Metformin 1500 mg - 06/08/22 90 DS Simvastatin 20 mg - 05/11/22 90 DS - Patient stated her pharmacy keeps sending it so early. Adherence rates for medications indicated for disease state being reviewed: None. Does the patient have >5 day gap between last estimated fill dates for any of the above medications?: No Disease State Questions Able to connect with the Patient?: Yes Is the patient monitoring his/her BP?: Yes How often are you checking your BP?: occasionally Home BP Reading #1 (most recent): Patient stated her daughter does it every evening but she cant remember what it was  Is the patient having any low BP Readings <90/60?: No Is the patient having any BP readings above >180/100?: No Is the patient's average BP>140/90?: No What is your blood pressure goal?: 120/80 Educate patient to inform proper points on checking BP at home:: Do not drink caffeine or smoke a cigarette at least 30 min. prior to checking., Sit with feet flat on the floor, arm at heart level. What diet changes have you made to improve your Blood Pressure Control?: eating more home-cooked meals What exercise are you doing to improve your Blood Pressure Control?: walking Misc. Response/Information:: Patient stated her daughter usually tells her that her blood pressure is  good/fine. Clinical Lead Review Review Adherence gaps identified?: No Drug Therapy Problems identified?: No Assessment: Controlled  Engagement Notes Charlann Lange on 08/24/2022 02:09 PM [facility] Chart Review: 10 min 08/24/22 [facility] Assessment call time spent: 10 min 08/24/22   Engagement Notes Jerral Ralph on 08/25/2022 07:48 PM Reviewed : 3 mins 7.45pm to 7.48pm

## 2022-09-07 ENCOUNTER — Telehealth: Payer: Self-pay

## 2022-09-07 NOTE — Telephone Encounter (Signed)
Pt LM asking for call back didn't say what

## 2022-09-08 ENCOUNTER — Other Ambulatory Visit: Payer: Self-pay

## 2022-09-08 MED ORDER — MEMANTINE HCL 5 MG PO TABS: 5.0000 mg | ORAL_TABLET | Freq: Every day | ORAL | 3 refills | Status: DC

## 2022-09-08 MED ORDER — CHLORTHALIDONE 25 MG PO TABS: 25.0000 mg | ORAL_TABLET | Freq: Every day | ORAL | 3 refills | Status: DC

## 2022-09-08 MED ORDER — OMEPRAZOLE 40 MG PO CPDR: 40.0000 mg | DELAYED_RELEASE_CAPSULE | Freq: Every day | ORAL | 3 refills | Status: DC

## 2022-09-08 MED ORDER — IBUPROFEN 800 MG PO TABS: 800.0000 mg | ORAL_TABLET | Freq: Every day | ORAL | 3 refills | Status: DC

## 2022-09-08 MED ORDER — SIMVASTATIN 20 MG PO TABS: 20.0000 mg | ORAL_TABLET | Freq: Every day | ORAL | 3 refills | Status: DC

## 2022-09-08 MED ORDER — AMLODIPINE BESYLATE 5 MG PO TABS: 5.0000 mg | ORAL_TABLET | Freq: Every day | ORAL | 3 refills | Status: DC

## 2022-09-08 MED ORDER — POTASSIUM CHLORIDE CRYS ER 20 MEQ PO TBCR: 20.0000 meq | EXTENDED_RELEASE_TABLET | Freq: Every day | ORAL | 3 refills | Status: DC

## 2022-09-10 NOTE — Telephone Encounter (Signed)
Spoke with patient the  yesterday she needed to get rx sent to her [facility]

## 2022-09-19 DIAGNOSIS — I10 Essential (primary) hypertension: Secondary | ICD-10-CM | POA: Diagnosis not present

## 2022-09-19 DIAGNOSIS — E785 Hyperlipidemia, unspecified: Secondary | ICD-10-CM | POA: Diagnosis not present

## 2022-09-21 DIAGNOSIS — M1712 Unilateral primary osteoarthritis, left knee: Secondary | ICD-10-CM | POA: Diagnosis not present

## 2022-09-28 ENCOUNTER — Encounter: Payer: Self-pay | Admitting: Nurse Practitioner

## 2022-09-28 ENCOUNTER — Ambulatory Visit (INDEPENDENT_AMBULATORY_CARE_PROVIDER_SITE_OTHER): Payer: 59 | Admitting: Nurse Practitioner

## 2022-09-28 VITALS — BP 124/72 | HR 103 | Ht 64.0 in | Wt 190.0 lb

## 2022-09-28 DIAGNOSIS — R5383 Other fatigue: Secondary | ICD-10-CM | POA: Insufficient documentation

## 2022-09-28 DIAGNOSIS — R7303 Prediabetes: Secondary | ICD-10-CM | POA: Insufficient documentation

## 2022-09-28 DIAGNOSIS — E663 Overweight: Secondary | ICD-10-CM

## 2022-09-28 DIAGNOSIS — R059 Cough, unspecified: Secondary | ICD-10-CM | POA: Diagnosis not present

## 2022-09-28 MED ORDER — BENZONATATE 100 MG PO CAPS: 100.0000 mg | ORAL_CAPSULE | Freq: Three times a day (TID) | ORAL | 1 refills | Status: DC | PRN

## 2022-09-28 NOTE — Patient Instructions (Signed)
1) Tessalon perles for cough 2) Fasting labs today 3) Follow up appt in 4 months, labs at that appt

## 2022-09-28 NOTE — Progress Notes (Signed)
Established Patient Office Visit  Subjective:  Patient ID: [name] [name], female    DOB: [birth year]  Age: 82 y.o. MRN: [id number]  Chief Complaint  Patient presents with   Follow-up    4 month follow up, review lab results.    Follow up and will obtain fasting labs this AM.  Patient c/o of frequent coughing so will add on tessalon perles to see if this helps.  Left knee steroid injection by Ortho last week, good AROM.    No other concerns at this time.   Past Medical History:  Diagnosis Date   GERD (gastroesophageal reflux disease)    Hypertension     No past surgical history on file.  Social History   Socioeconomic History   Marital status: Married    Spouse name: Not on file   Number of children: Not on file   Years of education: Not on file   Highest education level: Not on file  Occupational History   Not on file  Tobacco Use   Smoking status: Former   Smokeless tobacco: Never  Substance and Sexual Activity   Alcohol use: Yes    Comment: occ   Drug use: Not Currently   Sexual activity: Not Currently  Other Topics Concern   Not on file  Social History Narrative   Not on file   Social Determinants of Health   Financial Resource Strain: Not on file  Food Insecurity: Not on file  Transportation Needs: Not on file  Physical Activity: Not on file  Stress: Not on file  Social Connections: Not on file  Intimate Partner Violence: Not on file    Family History  Problem Relation Age of Onset   Breast cancer Neg Hx     No Known Allergies  Review of Systems  Constitutional: Negative.   HENT: Negative.    Eyes: Negative.   Respiratory:  Positive for cough.   Cardiovascular: Negative.   Gastrointestinal: Negative.   Genitourinary: Negative.   Musculoskeletal:  Positive for joint pain.  Skin: Negative.        Objective:   BP 124/72   Pulse (!) 103   Ht 5\' 4"  (1.626 m)   Wt 190 lb (86.2 kg)   SpO2 98%   BMI 32.61 kg/m   Vitals:    09/28/22 0907  BP: 124/72  Pulse: (!) 103  Height: 5\' 4"  (1.626 m)  Weight: 190 lb (86.2 kg)  SpO2: 98%  BMI (Calculated): 32.6    Physical Exam Vitals reviewed.  Constitutional:      Appearance: Normal appearance.  HENT:     Head: Normocephalic.     Nose: Nose normal.     Mouth/Throat:     Mouth: Mucous membranes are moist.  Eyes:     Pupils: Pupils are equal, round, and reactive to light.  Cardiovascular:     Rate and Rhythm: Normal rate and regular rhythm.  Pulmonary:     Effort: Pulmonary effort is normal.     Breath sounds: Normal breath sounds.  Abdominal:     General: Bowel sounds are normal.     Palpations: Abdomen is soft.  Musculoskeletal:        General: Tenderness present.     Cervical back: Normal range of motion and neck supple.  Skin:    General: Skin is warm and dry.  Neurological:     Mental Status: She is alert and oriented to person, place, and time.  Psychiatric:  Mood and Affect: Mood normal.        Behavior: Behavior normal.      No results found for any visits on 09/28/22.      Assessment & Plan:   Problem List Items Addressed This Visit   None   No follow-ups on file.   Total time spent: 35 minutes  Orson Eva, NP  09/28/2022

## 2022-09-29 LAB — HEMOGLOBIN A1C
Est. average glucose Bld gHb Est-mCnc: 140 mg/dL
Hgb A1c MFr Bld: 6.5 % — ABNORMAL HIGH (ref 4.8–5.6)

## 2022-09-29 LAB — CMP14+EGFR
ALT: 10 IU/L (ref 0–32)
AST: 19 IU/L (ref 0–40)
Albumin/Globulin Ratio: 1.5 (ref 1.2–2.2)
Albumin: 4.3 g/dL (ref 3.8–4.8)
Alkaline Phosphatase: 49 IU/L (ref 44–121)
BUN/Creatinine Ratio: 16 (ref 12–28)
BUN: 16 mg/dL (ref 8–27)
Bilirubin Total: 0.7 mg/dL (ref 0.0–1.2)
CO2: 25 mmol/L (ref 20–29)
Calcium: 9.9 mg/dL (ref 8.7–10.3)
Chloride: 101 mmol/L (ref 96–106)
Creatinine, Ser: 0.98 mg/dL (ref 0.57–1.00)
Globulin, Total: 2.8 g/dL (ref 1.5–4.5)
Glucose: 91 mg/dL (ref 70–99)
Potassium: 4.2 mmol/L (ref 3.5–5.2)
Sodium: 140 mmol/L (ref 134–144)
Total Protein: 7.1 g/dL (ref 6.0–8.5)
eGFR: 59 mL/min/{1.73_m2} — ABNORMAL LOW (ref >59)

## 2022-09-29 LAB — LIPID PANEL
Chol/HDL Ratio: 2 ratio (ref 0.0–4.4)
Cholesterol, Total: 138 mg/dL (ref 100–199)
HDL: 68 mg/dL (ref >39)
LDL Chol Calc (NIH): 58 mg/dL (ref 0–99)
Triglycerides: 53 mg/dL (ref 0–149)
VLDL Cholesterol Cal: 12 mg/dL (ref 5–40)

## 2022-09-29 LAB — TSH: TSH: 0.95 u[IU]/mL (ref 0.450–4.500)

## 2022-11-19 DIAGNOSIS — E785 Hyperlipidemia, unspecified: Secondary | ICD-10-CM | POA: Diagnosis not present

## 2022-11-19 DIAGNOSIS — I10 Essential (primary) hypertension: Secondary | ICD-10-CM | POA: Diagnosis not present

## 2022-11-22 ENCOUNTER — Telehealth: Payer: 59

## 2022-11-23 ENCOUNTER — Ambulatory Visit: Payer: 59

## 2022-11-24 NOTE — Chronic Care Management (AMB) (Signed)
Follow Up Pharmacist Visit (CCM)  [name] [name] [name]  82 years, Female  DOB: 1941-04-15  M:   __________________________________________________ Clinical Summary Next Pharmacist Follow Up: FPO september 2024 and 89yr F/U  Next AWV: to be scheduled Summary for PCP:  - Patient needs to have the following Dx added to Problem list - CKD 3a, HTN, Hyperlipidemia, GERD. These might not have transferred from old EMR - Patient needs an AWV scheduled. . Patient Chart Prep (HC) Chronic Conditions Patient's Chronic Conditions: Gastroesophageal Reflux Disease (GERD), Hypertension (HTN), Diabetes (DM) Doctor and Hospital Visits Were there PCP Visits since last visit with the Pharmacist?: Yes PCP Visits details: 4/9/24Karma [name] (PCP)- No medication changes. Were there Specialist Visits since last visit with the Pharmacist?: No Was there a Hospital Visit in last 30 days?: No Were there other Hospital Visits since last visit with the Pharmacist?: No Engagement Notes [name] [name] [name] on 11/17/2022 11:36 AM Rescheduled to 6/4 at 3p phone [name] [name] [name] on 08/02/2022 05:22 PM R/S appointment to 11/22/22 3PM Pre-Call Questions (HC) Pre-Call Questions Date:: 11/22/2022 Time:: PM Outcome:: Unsuccessful - Left message Relevant details: LVM with appt details . Engagement Notes [name] [name] [name] on 11/22/2022 02:50 PM HC NONBILLABLE TIME: 0:02:00  Disease Assessments Visit Date Visit Completed on: 11/23/2022 Subjective Information Subjective: She was peeing a lot - that was from the blood pressure pills.  Patient lives with her daughter [name] [name] [name] and her husband. She cleans up and sometimes cooks. She makes quilts - using her grand daughter's dresses. Lifestyle habits: Diet: Most morning cereal for breakfast, sandwich for lunch. Main meal is supper, makes salad. She drinks coffee and water, lemonade. Snacks are sour cream onione chips, peanut butter crackers.  Tobacco: none and alcohol none at present. What is  the patient's sleep pattern?: Other (with free form text) Details: 11pm to 7am (early riser), no naps during the day.  SDOH: Accountable [facility] Communities Health-Related Social Needs Screening Tool ([employer]) SDOH questions were documented in [facility] within the past 12 months or since hospitalization?: Yes Medication Adherence Does the [facility] have access to medication refill history?: No Is Patient using [facility] pharmacy?: No Name and location of Current pharmacy: [facility] mail order Current Rx insurance plan: [employer] Are meds delivered by current pharmacy?: Yes - by mail order pharmacy Assessment:: Adherent Hypertension (HTN) Most Recent BP: 124/72 Most Recent HR: 103 taken on: 09/28/2022 Care Gap: Need BP documented or last BP 140/90 or higher: Addressed Assessed today?: Yes BP today is: could not obtain today at the visit  Goal: <130/80 mmHG Is Patient checking BP at home?: Yes Has patient experienced hypotension, dizziness, falls or bradycardia?: No Patient has tried and failed: NSAIDS(Ibuprofen 800mg ) Additional Info: orthostatic hypotension We discussed: Contacting PCP office for signs and symptoms of high or low blood pressure (hypotension, dizziness, falls, headaches, edema) Assessment:: Controlled Drug: Amlodipine 5mg - 1 tablet daily.  Pharmacist Assessment: Appropriate, Effective, Safe, Accessible Drug: Chlorthalidone 25mg -1 tablet daily.  Pharmacist Assessment: Appropriate, Effective, Safe, Accessible Drug: potassium chloride 1 daily  Pharmacist Assessment: Appropriate, Effective, Safe, Accessible Diabetes (DM) Most recent A1C: 6.5 taken on: 09/28/2022 Most Recent GFR: 59 taken on: 09/28/2022 Type: Pre-Diabetes/Impaired Fasting Glucose Assessed today?: No Drug: Metformin 500mg - 1 tablet twice daily  Drug: Gabapentin 300mg - 1 tablet daily.  Plan/Follow up: Recommend start: Farxiga 5mg  daily for CKD,  concern about urinary frequency GERD Assessment Assessed today?: No Drug: Omeprazole 40mg - 1 tablet daily.  Preventative Health Care Gap: Colorectal cancer screening: Patient excluded from population (Age > 75, hx of  colorectal cancer or colectomy, hospice services) Care Gap: Breast cancer screening: Patient excluded from population (Age > 75, hx of bilateral mastectomy, frailty, hospice services) Care Gap: Annual Wellness Visit (AWV): Needs to be addressed Additional diet counseling points. We discussed: aiming to consume at least 8 cups of water day Plan/Follow up: needs an AWV Engagement Notes [name] [name] [name] on 11/23/2022 03:16 PM [phone] home phone [name] [name] [name] on 11/17/2022 02:23 PM [name] [name] [name]: HTN and GERD dx code is not listed in the problem list. Pulled from 4/9 office note. Patient is taking Simvastatin as well but no problem listed for medication.  [name] [name] [name] on 11/17/2022 02:15 PM HC Billed time: CCM:0:22:00 [name] [name] [name]  Televisit  Documentation 

## 2022-12-15 ENCOUNTER — Other Ambulatory Visit: Payer: Self-pay

## 2022-12-16 MED ORDER — METFORMIN HCL 500 MG PO TABS: 500.0000 mg | ORAL_TABLET | Freq: Two times a day (BID) | ORAL | 3 refills | Status: DC

## 2022-12-17 ENCOUNTER — Other Ambulatory Visit: Payer: Self-pay

## 2022-12-17 MED ORDER — METFORMIN HCL 500 MG PO TABS: 500.0000 mg | ORAL_TABLET | Freq: Two times a day (BID) | ORAL | 3 refills | Status: DC

## 2023-01-28 ENCOUNTER — Ambulatory Visit: Payer: 59 | Admitting: Cardiology

## 2023-01-31 ENCOUNTER — Encounter: Payer: Self-pay | Admitting: Cardiology

## 2023-01-31 DIAGNOSIS — N1831 Chronic kidney disease, stage 3a: Secondary | ICD-10-CM | POA: Insufficient documentation

## 2023-01-31 DIAGNOSIS — K219 Gastro-esophageal reflux disease without esophagitis: Secondary | ICD-10-CM | POA: Insufficient documentation

## 2023-01-31 DIAGNOSIS — K2100 Gastro-esophageal reflux disease with esophagitis, without bleeding: Secondary | ICD-10-CM

## 2023-01-31 DIAGNOSIS — E785 Hyperlipidemia, unspecified: Secondary | ICD-10-CM | POA: Insufficient documentation

## 2023-01-31 DIAGNOSIS — I10 Essential (primary) hypertension: Secondary | ICD-10-CM

## 2023-01-31 DIAGNOSIS — I152 Hypertension secondary to endocrine disorders: Secondary | ICD-10-CM | POA: Insufficient documentation

## 2023-02-01 ENCOUNTER — Encounter: Payer: Self-pay | Admitting: Cardiology

## 2023-02-01 ENCOUNTER — Ambulatory Visit (INDEPENDENT_AMBULATORY_CARE_PROVIDER_SITE_OTHER): Payer: 59 | Admitting: Cardiology

## 2023-02-01 VITALS — BP 122/84 | HR 76 | Ht 66.0 in | Wt 190.4 lb

## 2023-02-01 DIAGNOSIS — I10 Essential (primary) hypertension: Secondary | ICD-10-CM

## 2023-02-01 DIAGNOSIS — E782 Mixed hyperlipidemia: Secondary | ICD-10-CM | POA: Diagnosis not present

## 2023-02-01 DIAGNOSIS — R7303 Prediabetes: Secondary | ICD-10-CM

## 2023-02-01 DIAGNOSIS — K2100 Gastro-esophageal reflux disease with esophagitis, without bleeding: Secondary | ICD-10-CM | POA: Diagnosis not present

## 2023-02-01 NOTE — Progress Notes (Signed)
Established Patient Office Visit  Subjective:  Patient ID: [name] [name], female    DOB: 02-07-1941  Age: 82 y.o. MRN: [id number]  Chief Complaint  Patient presents with   Follow-up    4 month follow up    Patient in office for 4 month follow up. Did not have blood work done. Patient feeling well today, no new complaints. Patient fasting for blood work.     No other concerns at this time.   Past Medical History:  Diagnosis Date   GERD (gastroesophageal reflux disease)    Hypertension     History reviewed. No pertinent surgical history.  Social History   Socioeconomic History   Marital status: Married    Spouse name: Not on file   Number of children: Not on file   Years of education: Not on file   Highest education level: Not on file  Occupational History   Not on file  Tobacco Use   Smoking status: Former   Smokeless tobacco: Never  Substance and Sexual Activity   Alcohol use: Yes    Comment: occ   Drug use: Not Currently   Sexual activity: Not Currently  Other Topics Concern   Not on file  Social History Narrative   Not on file   Social Determinants of Health   Financial Resource Strain: Not on file  Food Insecurity: Not on file  Transportation Needs: Not on file  Physical Activity: Not on file  Stress: Not on file  Social Connections: Not on file  Intimate Partner Violence: Not on file    Family History  Problem Relation Age of Onset   Breast cancer Neg Hx     No Known Allergies  Outpatient Encounter Medications as of 02/01/2023  Medication Sig   Alcohol Swabs (DROPSAFE ALCOHOL PREP) 70 % PADS    amLODipine (NORVASC) 5 MG tablet Take 1 tablet (5 mg total) by mouth daily.   chlorthalidone (HYGROTON) 25 MG tablet Take 1 tablet (25 mg total) by mouth daily.   Cholecalciferol (VITAMIN D3) 25 MCG (1000 UT) CAPS Take by mouth.   gabapentin (NEURONTIN) 300 MG capsule Take by mouth.   memantine (NAMENDA) 5 MG tablet Take 1 tablet (5 mg total) by  mouth daily.   metFORMIN (GLUCOPHAGE) 500 MG tablet Take 1 tablet (500 mg total) by mouth 2 (two) times daily with a meal.   omeprazole (PRILOSEC) 40 MG capsule Take 1 capsule (40 mg total) by mouth daily.   potassium chloride SA (KLOR-CON M20) 20 MEQ tablet Take 1 tablet (20 mEq total) by mouth daily.   TRUE METRIX BLOOD GLUCOSE TEST test strip 1 each by Other route.   TRUEplus Lancets 33G MISC    buPROPion (WELLBUTRIN XL) 150 MG 24 hr tablet Take 1 tablet by mouth daily. (Patient not taking: Reported on 02/01/2023)   HYDROcodone-acetaminophen (NORCO/VICODIN) 5-325 MG tablet Take 1 tablet by mouth every 6 (six) hours as needed for moderate pain. (Patient not taking: Reported on 02/01/2023)   simvastatin (ZOCOR) 20 MG tablet Take 1 tablet (20 mg total) by mouth daily. (Patient not taking: Reported on 02/01/2023)   [DISCONTINUED] benzonatate (TESSALON PERLES) 100 MG capsule Take 1 capsule (100 mg total) by mouth 3 (three) times daily as needed for cough. (Patient not taking: Reported on 11/23/2022)   [DISCONTINUED] clotrimazole-betamethasone (LOTRISONE) cream APPLY TOPICALLY TO THE AFFECTED AREA TWICE DAILY (Patient not taking: Reported on 09/28/2022)   [DISCONTINUED] clotrimazole-betamethasone (LOTRISONE) cream Apply 1 application topically 2 (two) times daily. (  Patient not taking: Reported on 09/28/2022)   [DISCONTINUED] ibuprofen (ADVIL) 800 MG tablet Take 1 tablet (800 mg total) by mouth daily. (Patient not taking: Reported on 11/23/2022)   No facility-administered encounter medications on file as of 02/01/2023.     Review of Systems  Constitutional: Negative.   HENT: Negative.    Eyes: Negative.   Respiratory: Negative.  Negative for shortness of breath.   Cardiovascular: Negative.  Negative for chest pain.  Gastrointestinal: Negative.  Negative for abdominal pain, constipation and diarrhea.  Genitourinary: Negative.   Musculoskeletal:  Negative for joint pain and myalgias.  Skin: Negative.    Neurological: Negative.  Negative for dizziness and headaches.  Endo/Heme/Allergies: Negative.   All other systems reviewed and are negative.      Objective:   BP 122/84   Pulse 76   Ht 5\' 6"  (1.676 m)   Wt 190 lb 6.4 oz (86.4 kg)   SpO2 99%   BMI 30.73 kg/m   Vitals:   02/01/23 1151  BP: 122/84  Pulse: 76  Height: 5\' 6"  (1.676 m)  Weight: 190 lb 6.4 oz (86.4 kg)  SpO2: 99%  BMI (Calculated): 30.75    Physical Exam Vitals and nursing note reviewed.  Constitutional:      Appearance: Normal appearance. She is normal weight.  HENT:     Head: Normocephalic and atraumatic.     Nose: Nose normal.     Mouth/Throat:     Mouth: Mucous membranes are moist.  Eyes:     Extraocular Movements: Extraocular movements intact.     Conjunctiva/sclera: Conjunctivae normal.     Pupils: Pupils are equal, round, and reactive to light.  Cardiovascular:     Rate and Rhythm: Normal rate and regular rhythm.     Pulses: Normal pulses.     Heart sounds: Normal heart sounds.  Pulmonary:     Effort: Pulmonary effort is normal.     Breath sounds: Normal breath sounds.  Abdominal:     General: Abdomen is flat. Bowel sounds are normal.     Palpations: Abdomen is soft.  Musculoskeletal:        General: Normal range of motion.     Cervical back: Normal range of motion.  Skin:    General: Skin is warm and dry.  Neurological:     General: No focal deficit present.     Mental Status: She is alert and oriented to person, place, and time.  Psychiatric:        Mood and Affect: Mood normal.        Behavior: Behavior normal.        Thought Content: Thought content normal.        Judgment: Judgment normal.      No results found for any visits on 02/01/23.  No results found for this or any previous visit (from the past 2160 hour(s)).    Assessment & Plan:  Continue all medications. Fasting blood work today.  Problem List Items Addressed This Visit       Cardiovascular and  Mediastinum   Hypertension, essential - Primary   Relevant Orders   Lipid Profile   CMP14+EGFR   TSH   Hemoglobin A1c     Digestive   GERD (gastroesophageal reflux disease)   Relevant Orders   Lipid Profile   CMP14+EGFR   TSH   Hemoglobin A1c     Other   Prediabetes   Relevant Orders   Lipid Profile   CMP14+EGFR  TSH   Hemoglobin A1c   Hyperlipemia   Relevant Orders   Lipid Profile   CMP14+EGFR   TSH   Hemoglobin A1c    Return in about 4 months (around 06/03/2023).   Total time spent: 25 minutes  Google, NP  02/01/2023   This document may have been prepared by Dragon Voice Recognition software and as such may include unintentional dictation errors.

## 2023-02-02 NOTE — Progress Notes (Signed)
Spoke with pt daughter who verified DOB and verbalized understanding.

## 2023-02-14 ENCOUNTER — Telehealth: Payer: Self-pay

## 2023-02-14 NOTE — Telephone Encounter (Signed)
Patients daughter is requesting a letter stating that she has lived with her since 2022 at the address on file, I have the paper with more information on it in your inbox.

## 2023-02-17 ENCOUNTER — Other Ambulatory Visit: Payer: Self-pay | Admitting: Cardiology

## 2023-02-17 ENCOUNTER — Encounter: Payer: Self-pay | Admitting: Cardiology

## 2023-02-17 NOTE — Progress Notes (Signed)
Opened in error

## 2023-03-09 ENCOUNTER — Telehealth: Payer: Self-pay | Admitting: Cardiology

## 2023-03-09 ENCOUNTER — Encounter: Payer: Self-pay | Admitting: Cardiology

## 2023-03-09 NOTE — Telephone Encounter (Signed)
Patients daughter Stark Jock came in stating that the letter you wrote for her, needs to include the dx of dementia.

## 2023-03-16 ENCOUNTER — Other Ambulatory Visit: Payer: Self-pay

## 2023-03-16 MED ORDER — GABAPENTIN 300 MG PO CAPS: 300.0000 mg | ORAL_CAPSULE | Freq: Every day | ORAL | 0 refills | Status: DC

## 2023-05-01 ENCOUNTER — Other Ambulatory Visit: Payer: Self-pay | Admitting: Cardiology

## 2023-05-12 ENCOUNTER — Other Ambulatory Visit: Payer: Self-pay

## 2023-05-13 ENCOUNTER — Encounter: Payer: Self-pay | Admitting: Podiatry

## 2023-05-13 ENCOUNTER — Ambulatory Visit (INDEPENDENT_AMBULATORY_CARE_PROVIDER_SITE_OTHER): Payer: 59

## 2023-05-13 ENCOUNTER — Ambulatory Visit (INDEPENDENT_AMBULATORY_CARE_PROVIDER_SITE_OTHER): Payer: 59 | Admitting: Podiatry

## 2023-05-13 DIAGNOSIS — M779 Enthesopathy, unspecified: Secondary | ICD-10-CM

## 2023-05-13 DIAGNOSIS — M778 Other enthesopathies, not elsewhere classified: Secondary | ICD-10-CM | POA: Diagnosis not present

## 2023-05-13 MED ORDER — CHLORTHALIDONE 25 MG PO TABS: 25.0000 mg | ORAL_TABLET | Freq: Every day | ORAL | 1 refills | Status: DC

## 2023-05-13 MED ORDER — OMEPRAZOLE 40 MG PO CPDR: 40.0000 mg | DELAYED_RELEASE_CAPSULE | Freq: Every day | ORAL | 3 refills | Status: DC

## 2023-05-13 MED ORDER — AMLODIPINE BESYLATE 5 MG PO TABS: 5.0000 mg | ORAL_TABLET | Freq: Every day | ORAL | 1 refills | Status: DC

## 2023-05-13 MED ORDER — MEMANTINE HCL 5 MG PO TABS: 5.0000 mg | ORAL_TABLET | Freq: Every day | ORAL | 1 refills | Status: DC

## 2023-05-13 MED ORDER — IBUPROFEN 800 MG PO TABS: 800.0000 mg | ORAL_TABLET | Freq: Every day | ORAL | 1 refills | Status: DC

## 2023-05-13 MED ORDER — BETAMETHASONE SOD PHOS & ACET 6 (3-3) MG/ML IJ SUSP
3.0000 mg | Freq: Once | INTRAMUSCULAR | Status: AC
  Administered 2023-05-13: 3 mg via INTRA_ARTICULAR

## 2023-05-13 MED ORDER — MELOXICAM 15 MG PO TABS: 15.0000 mg | ORAL_TABLET | Freq: Every day | ORAL | 1 refills | Status: AC

## 2023-05-13 MED ORDER — POTASSIUM CHLORIDE CRYS ER 20 MEQ PO TBCR: 20.0000 meq | EXTENDED_RELEASE_TABLET | Freq: Every day | ORAL | 1 refills | Status: DC

## 2023-05-13 NOTE — Progress Notes (Signed)
   Chief Complaint  Patient presents with   Diabetes    "This foot just hurts and burns,  I was here about it one time" N - foot hurts and burns L - lateral pain left D - about 1 week O - suddenly C - feels like somebody driving nails in it, burns, can't sleep at night A - walking T - Epsom Salt soaks,   Voltaren Gel    HPI: 82 y.o. female presenting today for evaluation of pain and tenderness to the left forefoot.  Onset about 1 week ago.  Denies a history of injury.  Idiopathic onset.  She has been soaking it in warm water and Epsom salt and applying Voltaren gel.  She does admit to walking around the house barefoot  Past Medical History:  Diagnosis Date   GERD (gastroesophageal reflux disease)    Hypertension     No past surgical history on file.  No Known Allergies   Physical Exam: General: The patient is alert and oriented x3 in no acute distress.  Dermatology: Skin is warm, dry and supple bilateral lower extremities.   Vascular: Palpable pedal pulses bilaterally. Capillary refill within normal limits.  No appreciable edema.  No erythema.  Neurological: Grossly intact via light touch  Musculoskeletal Exam: No pedal deformities noted.  There is tenderness with palpation around the lateral aspect of the left forefoot around the distal portion of the fourth and fifth metatarsals  Radiographic Exam LT foot 05/13/2023:  Normal osseous mineralization. Joint spaces preserved.  No fractures or osseous irregularities noted.  Impression: Negative  Assessment/Plan of Care: 1.  Capsulitis left lateral forefoot  -Patient evaluated.  X-rays reviewed -Injection of 0.5 cc Celestone Soluspan injected around the distal lateral portion of the left forefoot -Prescription for meloxicam 15 mg daily -Recommend rest with elevation and ice -Postop shoe dispensed.  WBAT -Return to clinic 3-4 weeks.  If there is no improvement we will need to get follow-up x-rays       Felecia Shelling,  DPM [facility]  Dr. Felecia Shelling, DPM    [address] [place], [place] [address]                Office [phone]  Fax [phone]

## 2023-05-16 ENCOUNTER — Other Ambulatory Visit: Payer: Self-pay

## 2023-05-16 MED ORDER — SIMVASTATIN 20 MG PO TABS: 20.0000 mg | ORAL_TABLET | Freq: Every day | ORAL | 3 refills | Status: DC

## 2023-06-03 ENCOUNTER — Encounter: Payer: Self-pay | Admitting: Cardiology

## 2023-06-03 ENCOUNTER — Ambulatory Visit (INDEPENDENT_AMBULATORY_CARE_PROVIDER_SITE_OTHER): Payer: 59 | Admitting: Cardiology

## 2023-06-03 VITALS — BP 138/88 | HR 53 | Ht 66.0 in | Wt 190.6 lb

## 2023-06-03 DIAGNOSIS — E782 Mixed hyperlipidemia: Secondary | ICD-10-CM | POA: Diagnosis not present

## 2023-06-03 DIAGNOSIS — Z1329 Encounter for screening for other suspected endocrine disorder: Secondary | ICD-10-CM | POA: Diagnosis not present

## 2023-06-03 DIAGNOSIS — R7303 Prediabetes: Secondary | ICD-10-CM

## 2023-06-03 DIAGNOSIS — I10 Essential (primary) hypertension: Secondary | ICD-10-CM | POA: Diagnosis not present

## 2023-06-03 MED ORDER — GABAPENTIN 400 MG PO CAPS: 400.0000 mg | ORAL_CAPSULE | Freq: Every day | ORAL | 4 refills | Status: DC

## 2023-06-03 NOTE — Progress Notes (Signed)
Established Patient Office Visit  Subjective:  Patient ID: [name] [name], female    DOB: 10/20/1940  Age: 82 y.o. MRN: [id number]  Chief Complaint  Patient presents with   Follow-up    4 month follow up Gabapentin not helping her hand      Patient in office for 4 month follow up. Patient doing well, no new complaints. States right hand pain is worse, gabapentin no longer helping. Will increase to 400 mg at bedtime.  Patient fasting, will do lab work today.     No other concerns at this time.   Past Medical History:  Diagnosis Date   GERD (gastroesophageal reflux disease)    Hypertension     History reviewed. No pertinent surgical history.  Social History   Socioeconomic History   Marital status: Married    Spouse name: Not on file   Number of children: Not on file   Years of education: Not on file   Highest education level: Not on file  Occupational History   Not on file  Tobacco Use   Smoking status: Former   Smokeless tobacco: Never  Substance and Sexual Activity   Alcohol use: Not Currently    Comment: occ   Drug use: Not Currently   Sexual activity: Not Currently  Other Topics Concern   Not on file  Social History Narrative   Not on file   Social Drivers of Health   Financial Resource Strain: Not on file  Food Insecurity: Not on file  Transportation Needs: Not on file  Physical Activity: Not on file  Stress: Not on file  Social Connections: Not on file  Intimate Partner Violence: Not on file    Family History  Problem Relation Age of Onset   Breast cancer Neg Hx     No Known Allergies  Outpatient Medications Prior to Visit  Medication Sig   Alcohol Swabs (DROPSAFE ALCOHOL PREP) 70 % PADS    amLODipine (NORVASC) 5 MG tablet Take 1 tablet (5 mg total) by mouth daily.   buPROPion (WELLBUTRIN XL) 150 MG 24 hr tablet Take 1 tablet by mouth daily.   chlorthalidone (HYGROTON) 25 MG tablet Take 1 tablet (25 mg total) by mouth daily.    Cholecalciferol (VITAMIN D3) 25 MCG (1000 UT) CAPS Take by mouth.   ibuprofen (ADVIL) 800 MG tablet Take 1 tablet (800 mg total) by mouth daily.   meloxicam (MOBIC) 15 MG tablet Take 1 tablet (15 mg total) by mouth daily.   memantine (NAMENDA) 5 MG tablet Take 1 tablet (5 mg total) by mouth daily.   metFORMIN (GLUCOPHAGE) 500 MG tablet Take 1 tablet (500 mg total) by mouth 2 (two) times daily with a meal.   omeprazole (PRILOSEC) 40 MG capsule Take 1 capsule (40 mg total) by mouth daily.   potassium chloride SA (KLOR-CON M20) 20 MEQ tablet Take 1 tablet (20 mEq total) by mouth daily.   simvastatin (ZOCOR) 20 MG tablet Take 1 tablet (20 mg total) by mouth daily.   TRUE METRIX BLOOD GLUCOSE TEST test strip 1 each by Other route.   TRUEplus Lancets 33G MISC    [DISCONTINUED] gabapentin (NEURONTIN) 300 MG capsule Take 1 capsule (300 mg total) by mouth at bedtime.   [DISCONTINUED] HYDROcodone-acetaminophen (NORCO/VICODIN) 5-325 MG tablet Take 1 tablet by mouth every 6 (six) hours as needed for moderate pain. (Patient not taking: Reported on 05/13/2023)   No facility-administered medications prior to visit.    Review of Systems  Constitutional:  Negative.   HENT: Negative.    Eyes: Negative.   Respiratory: Negative.  Negative for shortness of breath.   Cardiovascular: Negative.  Negative for chest pain.  Gastrointestinal: Negative.  Negative for abdominal pain, constipation and diarrhea.  Genitourinary: Negative.   Musculoskeletal:  Negative for joint pain and myalgias.       Right hand pain  Skin: Negative.   Neurological: Negative.  Negative for dizziness and headaches.  Endo/Heme/Allergies: Negative.   All other systems reviewed and are negative.      Objective:   BP 138/88   Pulse (!) 53   Ht 5\' 6"  (1.676 m)   Wt 190 lb 9.6 oz (86.5 kg)   SpO2 96%   BMI 30.76 kg/m   Vitals:   06/03/23 1009  BP: 138/88  Pulse: (!) 53  Height: 5\' 6"  (1.676 m)  Weight: 190 lb 9.6 oz (86.5  kg)  SpO2: 96%  BMI (Calculated): 30.78    Physical Exam Vitals and nursing note reviewed.  Constitutional:      Appearance: Normal appearance. She is normal weight.  HENT:     Head: Normocephalic and atraumatic.     Nose: Nose normal.     Mouth/Throat:     Mouth: Mucous membranes are moist.  Eyes:     Extraocular Movements: Extraocular movements intact.     Conjunctiva/sclera: Conjunctivae normal.     Pupils: Pupils are equal, round, and reactive to light.  Cardiovascular:     Rate and Rhythm: Normal rate and regular rhythm.     Pulses: Normal pulses.     Heart sounds: Normal heart sounds.  Pulmonary:     Effort: Pulmonary effort is normal.     Breath sounds: Normal breath sounds.  Abdominal:     General: Abdomen is flat. Bowel sounds are normal.     Palpations: Abdomen is soft.  Musculoskeletal:        General: Normal range of motion.     Cervical back: Normal range of motion.  Skin:    General: Skin is warm and dry.  Neurological:     General: No focal deficit present.     Mental Status: She is alert and oriented to person, place, and time.  Psychiatric:        Mood and Affect: Mood normal.        Behavior: Behavior normal.        Thought Content: Thought content normal.        Judgment: Judgment normal.      No results found for any visits on 06/03/23.  No results found for this or any previous visit (from the past 2160 hours).    Assessment & Plan:  Fasting lab work today.  Increase gabapentin to 400 mg at bedtime.   Problem List Items Addressed This Visit       Cardiovascular and Mediastinum   Hypertension, essential - Primary   Relevant Orders   CMP14+EGFR     Other   Prediabetes   Relevant Orders   Hemoglobin A1c   Hyperlipemia   Relevant Orders   Lipid Profile   Other Visit Diagnoses       Thyroid disorder screening       Relevant Orders   TSH       Return in about 4 months (around 10/02/2023).   Total time spent: 25  minutes  Google, NP  06/03/2023   This document may have been prepared by [employer] Voice Recognition software and as  such may include unintentional dictation errors.

## 2023-06-04 LAB — CMP14+EGFR
ALT: 10 [IU]/L (ref 0–32)
AST: 15 [IU]/L (ref 0–40)
Albumin: 4.4 g/dL (ref 3.8–4.8)
Alkaline Phosphatase: 50 [IU]/L (ref 44–121)
BUN/Creatinine Ratio: 19 (ref 12–28)
BUN: 21 mg/dL (ref 8–27)
Bilirubin Total: 0.6 mg/dL (ref 0.0–1.2)
CO2: 24 mmol/L (ref 20–29)
Calcium: 9.9 mg/dL (ref 8.7–10.3)
Chloride: 103 mmol/L (ref 96–106)
Creatinine, Ser: 1.08 mg/dL — ABNORMAL HIGH (ref 0.57–1.00)
Globulin, Total: 2.3 g/dL (ref 1.5–4.5)
Glucose: 97 mg/dL (ref 70–99)
Potassium: 4.1 mmol/L (ref 3.5–5.2)
Sodium: 141 mmol/L (ref 134–144)
Total Protein: 6.7 g/dL (ref 6.0–8.5)
eGFR: 52 mL/min/{1.73_m2} — ABNORMAL LOW (ref >59)

## 2023-06-04 LAB — LIPID PANEL
Chol/HDL Ratio: 2.1 {ratio} (ref 0.0–4.4)
Cholesterol, Total: 144 mg/dL (ref 100–199)
HDL: 67 mg/dL (ref >39)
LDL Chol Calc (NIH): 64 mg/dL (ref 0–99)
Triglycerides: 64 mg/dL (ref 0–149)
VLDL Cholesterol Cal: 13 mg/dL (ref 5–40)

## 2023-06-04 LAB — HEMOGLOBIN A1C
Est. average glucose Bld gHb Est-mCnc: 143 mg/dL
Hgb A1c MFr Bld: 6.6 % — ABNORMAL HIGH (ref 4.8–5.6)

## 2023-06-04 LAB — TSH: TSH: 1.06 u[IU]/mL (ref 0.450–4.500)

## 2023-06-09 ENCOUNTER — Encounter: Payer: Self-pay | Admitting: Cardiology

## 2023-06-09 ENCOUNTER — Ambulatory Visit (INDEPENDENT_AMBULATORY_CARE_PROVIDER_SITE_OTHER): Payer: 59 | Admitting: Cardiology

## 2023-06-09 VITALS — BP 114/70 | HR 58 | Ht 66.0 in | Wt 193.4 lb

## 2023-06-09 DIAGNOSIS — R590 Localized enlarged lymph nodes: Secondary | ICD-10-CM | POA: Insufficient documentation

## 2023-06-09 MED ORDER — METHYLPREDNISOLONE 4 MG PO TBPK: ORAL_TABLET | ORAL | 0 refills | Status: DC

## 2023-06-09 MED ORDER — AMOXICILLIN-POT CLAVULANATE 875-125 MG PO TABS: 1.0000 | ORAL_TABLET | Freq: Two times a day (BID) | ORAL | 0 refills | Status: AC

## 2023-06-09 NOTE — Progress Notes (Signed)
Established Patient Office Visit  Subjective:  Patient ID: [name] [name], female    DOB: 05-19-1941  Age: 82 y.o. MRN: [id number]  Chief Complaint  Patient presents with   Acute Visit    Swelling on left side of neck & face    Patient in office for an acute visit. Patient complaining of swelling on left side of neck and face. Patient states she noticed the swelling 2 days ago. Swelling is intermittent. Denies fever, states edema is sore to the touch only. Reports she did have a head cold last week. Swollen lymph node left side only. Will send in prednisone and Augmentin. Increase water intake.     No other concerns at this time.   Past Medical History:  Diagnosis Date   GERD (gastroesophageal reflux disease)    Hypertension     History reviewed. No pertinent surgical history.  Social History   Socioeconomic History   Marital status: Married    Spouse name: Not on file   Number of children: Not on file   Years of education: Not on file   Highest education level: Not on file  Occupational History   Not on file  Tobacco Use   Smoking status: Former   Smokeless tobacco: Never  Substance and Sexual Activity   Alcohol use: Not Currently    Comment: occ   Drug use: Not Currently   Sexual activity: Not Currently  Other Topics Concern   Not on file  Social History Narrative   Not on file   Social Drivers of Health   Financial Resource Strain: Not on file  Food Insecurity: Not on file  Transportation Needs: Not on file  Physical Activity: Not on file  Stress: Not on file  Social Connections: Not on file  Intimate Partner Violence: Not on file    Family History  Problem Relation Age of Onset   Breast cancer Neg Hx     No Known Allergies  Outpatient Medications Prior to Visit  Medication Sig   Alcohol Swabs (DROPSAFE ALCOHOL PREP) 70 % PADS    amLODipine (NORVASC) 5 MG tablet Take 1 tablet (5 mg total) by mouth daily.   buPROPion (WELLBUTRIN XL) 150 MG 24  hr tablet Take 1 tablet by mouth daily.   chlorthalidone (HYGROTON) 25 MG tablet Take 1 tablet (25 mg total) by mouth daily.   Cholecalciferol (VITAMIN D3) 25 MCG (1000 UT) CAPS Take by mouth.   gabapentin (NEURONTIN) 400 MG capsule Take 1 capsule (400 mg total) by mouth at bedtime.   ibuprofen (ADVIL) 800 MG tablet Take 1 tablet (800 mg total) by mouth daily.   meloxicam (MOBIC) 15 MG tablet Take 1 tablet (15 mg total) by mouth daily.   memantine (NAMENDA) 5 MG tablet Take 1 tablet (5 mg total) by mouth daily.   metFORMIN (GLUCOPHAGE) 500 MG tablet Take 1 tablet (500 mg total) by mouth 2 (two) times daily with a meal.   omeprazole (PRILOSEC) 40 MG capsule Take 1 capsule (40 mg total) by mouth daily.   potassium chloride SA (KLOR-CON M20) 20 MEQ tablet Take 1 tablet (20 mEq total) by mouth daily.   simvastatin (ZOCOR) 20 MG tablet Take 1 tablet (20 mg total) by mouth daily.   TRUE METRIX BLOOD GLUCOSE TEST test strip 1 each by Other route.   TRUEplus Lancets 33G MISC    No facility-administered medications prior to visit.    Review of Systems  Constitutional: Negative.   HENT: Negative.  Eyes: Negative.   Respiratory: Negative.  Negative for shortness of breath.   Cardiovascular: Negative.  Negative for chest pain.  Gastrointestinal: Negative.  Negative for abdominal pain, constipation and diarrhea.  Genitourinary: Negative.   Musculoskeletal:  Negative for joint pain and myalgias.  Skin: Negative.   Neurological: Negative.  Negative for dizziness and headaches.  Endo/Heme/Allergies: Negative.   All other systems reviewed and are negative.      Objective:   BP 114/70   Pulse (!) 58   Ht 5\' 6"  (1.676 m)   Wt 193 lb 6.4 oz (87.7 kg)   SpO2 94%   BMI 31.22 kg/m   Vitals:   06/09/23 1348  BP: 114/70  Pulse: (!) 58  Height: 5\' 6"  (1.676 m)  Weight: 193 lb 6.4 oz (87.7 kg)  SpO2: 94%  BMI (Calculated): 31.23    Physical Exam Vitals and nursing note reviewed.   Constitutional:      Appearance: Normal appearance. She is normal weight.  HENT:     Head: Normocephalic and atraumatic.     Nose: Nose normal.     Mouth/Throat:     Mouth: Mucous membranes are moist.  Eyes:     Extraocular Movements: Extraocular movements intact.     Conjunctiva/sclera: Conjunctivae normal.     Pupils: Pupils are equal, round, and reactive to light.  Neck:   Cardiovascular:     Rate and Rhythm: Normal rate and regular rhythm.     Pulses: Normal pulses.     Heart sounds: Normal heart sounds.  Pulmonary:     Effort: Pulmonary effort is normal.     Breath sounds: Normal breath sounds.  Abdominal:     General: Abdomen is flat. Bowel sounds are normal.     Palpations: Abdomen is soft.  Musculoskeletal:        General: Normal range of motion.     Cervical back: Normal range of motion.  Skin:    General: Skin is warm and dry.  Neurological:     General: No focal deficit present.     Mental Status: She is alert and oriented to person, place, and time.  Psychiatric:        Mood and Affect: Mood normal.        Behavior: Behavior normal.        Thought Content: Thought content normal.        Judgment: Judgment normal.      No results found for any visits on 06/09/23.  Recent Results (from the past 2160 hours)  Lipid Profile     Status: None   Collection Time: 06/03/23 11:20 AM  Result Value Ref Range   Cholesterol, Total 144 100 - 199 mg/dL   Triglycerides 64 0 - 149 mg/dL   HDL 67 >78 mg/dL   VLDL Cholesterol Cal 13 5 - 40 mg/dL   LDL Chol Calc (NIH) 64 0 - 99 mg/dL   Chol/HDL Ratio 2.1 0.0 - 4.4 ratio    Comment:                                   T. Chol/HDL Ratio                                             Men  Women  1/2 Avg.Risk  3.4    3.3                                   Avg.Risk  5.0    4.4                                2X Avg.Risk  9.6    7.1                                3X Avg.Risk 23.4   11.0   TSH      Status: None   Collection Time: 06/03/23 11:20 AM  Result Value Ref Range   TSH 1.060 0.450 - 4.500 uIU/mL  CMP14+EGFR     Status: Abnormal   Collection Time: 06/03/23 11:20 AM  Result Value Ref Range   Glucose 97 70 - 99 mg/dL   BUN 21 8 - 27 mg/dL   Creatinine, Ser 5.63 (H) 0.57 - 1.00 mg/dL   eGFR 52 (L) >87 FI/EPP/2.95   BUN/Creatinine Ratio 19 12 - 28   Sodium 141 134 - 144 mmol/L   Potassium 4.1 3.5 - 5.2 mmol/L   Chloride 103 96 - 106 mmol/L   CO2 24 20 - 29 mmol/L   Calcium 9.9 8.7 - 10.3 mg/dL   Total Protein 6.7 6.0 - 8.5 g/dL   Albumin 4.4 3.8 - 4.8 g/dL   Globulin, Total 2.3 1.5 - 4.5 g/dL   Bilirubin Total 0.6 0.0 - 1.2 mg/dL   Alkaline Phosphatase 50 44 - 121 IU/L   AST 15 0 - 40 IU/L   ALT 10 0 - 32 IU/L  Hemoglobin A1c     Status: Abnormal   Collection Time: 06/03/23 11:20 AM  Result Value Ref Range   Hgb A1c MFr Bld 6.6 (H) 4.8 - 5.6 %    Comment:          Prediabetes: 5.7 - 6.4          Diabetes: >6.4          Glycemic control for adults with diabetes: <7.0    Est. average glucose Bld gHb Est-mCnc 143 mg/dL      Assessment & Plan:  Prednisone Augmentin Increase fluids  Problem List Items Addressed This Visit       Immune and Lymphatic   Lymphadenopathy, submandibular - Primary    Return in about 1 week (around 06/16/2023).   Total time spent: 25 minutes  Google, NP  06/09/2023   This document may have been prepared by Dragon Voice Recognition software and as such may include unintentional dictation errors.

## 2023-06-10 ENCOUNTER — Encounter: Payer: Self-pay | Admitting: Podiatry

## 2023-06-10 ENCOUNTER — Ambulatory Visit (INDEPENDENT_AMBULATORY_CARE_PROVIDER_SITE_OTHER): Payer: 59 | Admitting: Podiatry

## 2023-06-10 DIAGNOSIS — M778 Other enthesopathies, not elsewhere classified: Secondary | ICD-10-CM | POA: Diagnosis not present

## 2023-06-10 NOTE — Progress Notes (Signed)
   Chief Complaint  Patient presents with   Foot Pain    "It's doing a whole lot better.  My other doctor had prescribed me some medicine for my hand and I believe it helped my foot too.  My foot is fine."    HPI: 82 y.o. female presenting today for follow-up evaluation of pain and tenderness to the left forefoot.  Patient states that she feels significantly better.  She has been wearing good supportive tennis shoes and sneakers and not going barefoot.  She has no more pain to the foot  Past Medical History:  Diagnosis Date   GERD (gastroesophageal reflux disease)    Hypertension     No past surgical history on file.  No Known Allergies   Physical Exam: General: The patient is alert and oriented x3 in no acute distress.  Dermatology: Skin is warm, dry and supple bilateral lower extremities.   Vascular: Palpable pedal pulses bilaterally. Capillary refill within normal limits.  No appreciable edema.  No erythema.  Neurological: Grossly intact via light touch  Musculoskeletal Exam: No pedal deformities noted.  There no tenderness today with palpation around the lateral aspect of the left forefoot around the distal portion of the fourth and fifth metatarsals  Radiographic Exam LT foot 05/13/2023:  Normal osseous mineralization. Joint spaces preserved.  No fractures or osseous irregularities noted.  Impression: Negative  Assessment/Plan of Care: 1.  Capsulitis left lateral forefoot  -Patient evaluated.   -Overall the patient is doing much better.  She has no pain or tenderness. -Continue wearing good supportive tennis shoes and sneakers -Return to clinic as needed       Felecia Shelling, DPM [facility]  Dr. Felecia Shelling, DPM    [address] [place], [place] [address]                Office [phone]  Fax [phone]

## 2023-06-21 ENCOUNTER — Ambulatory Visit: Payer: 59 | Admitting: Cardiology

## 2023-07-15 ENCOUNTER — Other Ambulatory Visit: Payer: Self-pay

## 2023-07-15 MED ORDER — GABAPENTIN 400 MG PO CAPS: 400.0000 mg | ORAL_CAPSULE | Freq: Every day | ORAL | 0 refills | Status: DC

## 2023-07-20 ENCOUNTER — Other Ambulatory Visit: Payer: Self-pay | Admitting: Cardiology

## 2023-07-21 ENCOUNTER — Other Ambulatory Visit: Payer: Self-pay | Admitting: Cardiology

## 2023-09-07 DIAGNOSIS — M170 Bilateral primary osteoarthritis of knee: Secondary | ICD-10-CM | POA: Diagnosis not present

## 2023-09-15 ENCOUNTER — Other Ambulatory Visit: Payer: Self-pay | Admitting: Cardiology

## 2023-09-20 ENCOUNTER — Ambulatory Visit (INDEPENDENT_AMBULATORY_CARE_PROVIDER_SITE_OTHER): Admitting: Podiatry

## 2023-09-20 ENCOUNTER — Ambulatory Visit (INDEPENDENT_AMBULATORY_CARE_PROVIDER_SITE_OTHER)

## 2023-09-20 ENCOUNTER — Other Ambulatory Visit: Payer: Self-pay | Admitting: Podiatry

## 2023-09-20 DIAGNOSIS — M2041 Other hammer toe(s) (acquired), right foot: Secondary | ICD-10-CM

## 2023-09-20 DIAGNOSIS — Z01818 Encounter for other preprocedural examination: Secondary | ICD-10-CM

## 2023-09-20 DIAGNOSIS — B353 Tinea pedis: Secondary | ICD-10-CM | POA: Diagnosis not present

## 2023-09-20 DIAGNOSIS — M25871 Other specified joint disorders, right ankle and foot: Secondary | ICD-10-CM | POA: Diagnosis not present

## 2023-09-20 NOTE — Progress Notes (Signed)
 Subjective:  Patient ID: [name] [name], female    DOB: July 24, 1940,  MRN: [id number]  Chief Complaint  Patient presents with   Tinea Pedis    83 y.o. female presents with the above complaint.  Patient presents with right second digit predislocation syndrome with continuous pain to the second toe.  Patient has a chronic deformity of the second digit that is rubbing against the shoe she has tried shoe gear modification padding offloading she would like to discuss elective amputation of that toe.  She also has secondary complaint of left foot subjective complaint of itching burning on the bottom of the feet.  Some skin epidermolysis she would like to discuss treatment option for athlete's foot as well.   Review of Systems: Negative except as noted in the HPI. Denies N/V/F/Ch.  Past Medical History:  Diagnosis Date   GERD (gastroesophageal reflux disease)    Hypertension     Current Outpatient Medications:    ciclopirox (PENLAC) 8 % solution, Apply topically at bedtime. Apply over nail and surrounding skin. Apply daily over previous coat. After seven (7) days, may remove with alcohol and continue cycle., Disp: 6.6 mL, Rfl: 0   clotrimazole-betamethasone (LOTRISONE) cream, Apply 1 Application topically daily., Disp: 30 g, Rfl: 0   Alcohol Swabs (DROPSAFE ALCOHOL PREP) 70 % PADS, , Disp: , Rfl:    amLODipine (NORVASC) 5 MG tablet, TAKE 1 TABLET BY MOUTH DAILY, Disp: 100 tablet, Rfl: 2   buPROPion (WELLBUTRIN XL) 150 MG 24 hr tablet, Take 1 tablet by mouth daily., Disp: , Rfl:    chlorthalidone (HYGROTON) 25 MG tablet, TAKE 1 TABLET BY MOUTH DAILY, Disp: 100 tablet, Rfl: 2   Cholecalciferol (VITAMIN D3) 25 MCG (1000 UT) CAPS, Take by mouth., Disp: , Rfl:    gabapentin (NEURONTIN) 400 MG capsule, TAKE 1 CAPSULE BY MOUTH AT  BEDTIME, Disp: 90 capsule, Rfl: 3   ibuprofen (ADVIL) 800 MG tablet, TAKE 1 TABLET BY MOUTH DAILY, Disp: 100 tablet, Rfl: 2   memantine (NAMENDA) 5 MG tablet, TAKE 1 TABLET  BY MOUTH DAILY, Disp: 100 tablet, Rfl: 2   metFORMIN (GLUCOPHAGE) 500 MG tablet, Take 1 tablet (500 mg total) by mouth 2 (two) times daily with a meal., Disp: 180 tablet, Rfl: 3   methylPREDNISolone (MEDROL DOSEPAK) 4 MG TBPK tablet, Take as directed, Disp: 1 each, Rfl: 0   omeprazole (PRILOSEC) 40 MG capsule, Take 1 capsule (40 mg total) by mouth daily., Disp: 90 capsule, Rfl: 3   potassium chloride SA (KLOR-CON M) 20 MEQ tablet, TAKE 1 TABLET BY MOUTH DAILY, Disp: 100 tablet, Rfl: 2   simvastatin (ZOCOR) 20 MG tablet, Take 1 tablet (20 mg total) by mouth daily., Disp: 90 tablet, Rfl: 3   TRUE METRIX BLOOD GLUCOSE TEST test strip, 1 each by Other route., Disp: , Rfl:    TRUEplus Lancets 33G MISC, , Disp: , Rfl:   Social History   Tobacco Use  Smoking Status Former  Smokeless Tobacco Never    No Known Allergies Objective:  There were no vitals filed for this visit. There is no height or weight on file to calculate BMI. Constitutional Well developed. Well nourished.  Vascular Dorsalis pedis pulses palpable bilaterally. Posterior tibial pulses palpable bilaterally. Capillary refill normal to all digits.  No cyanosis or clubbing noted. Pedal hair growth normal.  Neurologic Normal speech. Oriented to person, place, and time. Epicritic sensation to light touch grossly present bilaterally.  Dermatologic Nails well groomed and normal in appearance. No  open wounds. No skin lesions.  Orthopedic: Right second digit predislocation syndrome noted with chronic deformity of the second toe.  Severe bunion deformity noted causing elevation of the second digit.   Radiographs: None Assessment:   1. Hammer toe of right foot   2. Predislocation syndrome of metatarsophalangeal joint of right foot   3. Tinea pedis of left foot    Plan:  Patient was evaluated and treated and all questions answered.  Right second digit predislocation syndrome - All questions and concerns were discussed with the  patient in extensive detail - Given that patient has undergone predislocation syndrome causing elevation of the second digit leading to pressure spot I believe patient will benefit from second digit amputation I discussed this extensively with the patient she states understanding and would like to proceed with second digit amputation I discussed my preoperative or postoperative with the patient in extensive detail she states understanding  Athlete's foot left - I explained the patient etiology of athlete's foot emergency room and options were discussed.  Given the amount of athlete's foot that is present patient would benefit from Lotrisone cream asked her to apply twice a day she states understand ultrasound from was dispensed.  No follow-ups on file.

## 2023-09-21 ENCOUNTER — Telehealth: Payer: Self-pay | Admitting: Podiatry

## 2023-09-21 NOTE — Telephone Encounter (Signed)
 Dr. Allena Katz was supposed to call in 2 prescription from yesterday's appointment and the pharmacy do not have them. Please note the cream for athletics needs to sent in as a prescription and daughter could not remember what the 2nd medication was for.   [id number] [place], [place] - [address] AT [facility] OF [address] Phone: [phone]  Fax: [phone]

## 2023-09-22 ENCOUNTER — Other Ambulatory Visit: Payer: Self-pay | Admitting: Podiatry

## 2023-09-22 MED ORDER — CICLOPIROX 8 % EX SOLN: Freq: Every day | CUTANEOUS | 0 refills | Status: DC

## 2023-09-22 MED ORDER — CLOTRIMAZOLE-BETAMETHASONE 1-0.05 % EX CREA: 1.0000 | TOPICAL_CREAM | Freq: Every day | CUTANEOUS | 0 refills | Status: DC

## 2023-09-26 ENCOUNTER — Telehealth: Payer: Self-pay | Admitting: Podiatry

## 2023-09-26 NOTE — Telephone Encounter (Signed)
 DOS: 10/17/23  (RT) 2ND TOE AMPUTATION MPJ JOINT    EFFECTIVE DATE:  07/23/2023   DEDUCTIBLE: $257.00  REMAINING:  $257.00  OOP:  $9,350.00  REMAINING:  $9,083.98  CO INSURANCE :  PER THE [employer] WEB PORTAL NO PRIOR AUTH IS REQ FOR CPT CODE 56213  YQM#[id number]

## 2023-10-06 ENCOUNTER — Ambulatory Visit (INDEPENDENT_AMBULATORY_CARE_PROVIDER_SITE_OTHER): Payer: 59 | Admitting: Cardiology

## 2023-10-06 ENCOUNTER — Encounter: Payer: Self-pay | Admitting: Cardiology

## 2023-10-06 VITALS — BP 110/80 | HR 102 | Ht 66.0 in | Wt 185.0 lb

## 2023-10-06 DIAGNOSIS — E782 Mixed hyperlipidemia: Secondary | ICD-10-CM

## 2023-10-06 DIAGNOSIS — R7303 Prediabetes: Secondary | ICD-10-CM | POA: Diagnosis not present

## 2023-10-06 DIAGNOSIS — I10 Essential (primary) hypertension: Secondary | ICD-10-CM | POA: Diagnosis not present

## 2023-10-06 NOTE — Progress Notes (Signed)
 Established Patient Office Visit  Subjective:  Patient ID: [name] [name], female    DOB: 10-Jan-1941  Age: 83 y.o. MRN: [id number]  Chief Complaint  Patient presents with   Follow-up    4 Months Follow up    Patient in office for 4 month follow up. Patient doing well. No new complaints today.  Patient schedule for foot surgery with podiatry.  Fasting today, will get lab work.     No other concerns at this time.   Past Medical History:  Diagnosis Date   GERD (gastroesophageal reflux disease)    Hypertension     History reviewed. No pertinent surgical history.  Social History   Socioeconomic History   Marital status: Married    Spouse name: Not on file   Number of children: Not on file   Years of education: Not on file   Highest education level: Not on file  Occupational History   Not on file  Tobacco Use   Smoking status: Former   Smokeless tobacco: Never  Substance and Sexual Activity   Alcohol use: Yes    Comment: occ   Drug use: Not Currently   Sexual activity: Not Currently  Other Topics Concern   Not on file  Social History Narrative   Not on file   Social Drivers of Health   Financial Resource Strain: Not on file  Food Insecurity: Not on file  Transportation Needs: Not on file  Physical Activity: Not on file  Stress: Not on file  Social Connections: Not on file  Intimate Partner Violence: Not on file    Family History  Problem Relation Age of Onset   Breast cancer Neg Hx     No Known Allergies  Outpatient Medications Prior to Visit  Medication Sig   ciclopirox (PENLAC) 8 % solution Apply topically at bedtime. Apply over nail and surrounding skin. Apply daily over previous coat. After seven (7) days, may remove with alcohol and continue cycle.   clotrimazole-betamethasone (LOTRISONE) cream Apply 1 Application topically daily.   Alcohol Swabs (DROPSAFE ALCOHOL PREP) 70 % PADS    amLODipine (NORVASC) 5 MG tablet TAKE 1 TABLET BY MOUTH DAILY    buPROPion (WELLBUTRIN XL) 150 MG 24 hr tablet Take 1 tablet by mouth daily.   chlorthalidone (HYGROTON) 25 MG tablet TAKE 1 TABLET BY MOUTH DAILY   Cholecalciferol (VITAMIN D3) 25 MCG (1000 UT) CAPS Take by mouth.   gabapentin (NEURONTIN) 400 MG capsule TAKE 1 CAPSULE BY MOUTH AT  BEDTIME   ibuprofen (ADVIL) 800 MG tablet TAKE 1 TABLET BY MOUTH DAILY   memantine (NAMENDA) 5 MG tablet TAKE 1 TABLET BY MOUTH DAILY   metFORMIN (GLUCOPHAGE) 500 MG tablet Take 1 tablet (500 mg total) by mouth 2 (two) times daily with a meal.   omeprazole (PRILOSEC) 40 MG capsule Take 1 capsule (40 mg total) by mouth daily.   potassium chloride SA (KLOR-CON M) 20 MEQ tablet TAKE 1 TABLET BY MOUTH DAILY   simvastatin (ZOCOR) 20 MG tablet Take 1 tablet (20 mg total) by mouth daily.   TRUE METRIX BLOOD GLUCOSE TEST test strip 1 each by Other route.   TRUEplus Lancets 33G MISC    [DISCONTINUED] methylPREDNISolone (MEDROL DOSEPAK) 4 MG TBPK tablet Take as directed   No facility-administered medications prior to visit.    Review of Systems  Constitutional: Negative.   HENT: Negative.    Eyes: Negative.   Respiratory: Negative.  Negative for shortness of breath.   Cardiovascular:  Negative.  Negative for chest pain.  Gastrointestinal: Negative.  Negative for abdominal pain, constipation and diarrhea.  Genitourinary: Negative.   Musculoskeletal:  Negative for joint pain and myalgias.  Skin: Negative.   Neurological: Negative.  Negative for dizziness and headaches.  Endo/Heme/Allergies: Negative.   All other systems reviewed and are negative.      Objective:   BP 110/80   Pulse (!) 102   Ht 5\' 6"  (1.676 m)   Wt 185 lb (83.9 kg)   SpO2 98%   BMI 29.86 kg/m   Vitals:   10/06/23 0936  BP: 110/80  Pulse: (!) 102  Height: 5\' 6"  (1.676 m)  Weight: 185 lb (83.9 kg)  SpO2: 98%  BMI (Calculated): 29.87    Physical Exam Vitals and nursing note reviewed.  Constitutional:      Appearance: Normal  appearance. She is normal weight.  HENT:     Head: Normocephalic and atraumatic.     Nose: Nose normal.     Mouth/Throat:     Mouth: Mucous membranes are moist.  Eyes:     Extraocular Movements: Extraocular movements intact.     Conjunctiva/sclera: Conjunctivae normal.     Pupils: Pupils are equal, round, and reactive to light.  Cardiovascular:     Rate and Rhythm: Normal rate and regular rhythm.     Pulses: Normal pulses.     Heart sounds: Normal heart sounds.  Pulmonary:     Effort: Pulmonary effort is normal.     Breath sounds: Normal breath sounds.  Abdominal:     General: Abdomen is flat. Bowel sounds are normal.     Palpations: Abdomen is soft.  Musculoskeletal:        General: Normal range of motion.     Cervical back: Normal range of motion.  Skin:    General: Skin is warm and dry.  Neurological:     General: No focal deficit present.     Mental Status: She is alert and oriented to person, place, and time.  Psychiatric:        Mood and Affect: Mood normal.        Behavior: Behavior normal.        Thought Content: Thought content normal.        Judgment: Judgment normal.      No results found for any visits on 10/06/23.  No results found for this or any previous visit (from the past 2160 hours).    Assessment & Plan:  Fasting lab work today.  Continue same medications.  Problem List Items Addressed This Visit       Cardiovascular and Mediastinum   Hypertension, essential - Primary   Relevant Orders   CMP14+EGFR   CBC with Differential/Platelet     Other   Prediabetes   Relevant Orders   CMP14+EGFR   Hemoglobin A1c   CBC with Differential/Platelet   Hyperlipemia   Relevant Orders   Lipid Profile   CBC with Differential/Platelet    Return in about 4 months (around 02/05/2024).   Total time spent: 25 minutes  Google, NP  10/06/2023   This document may have been prepared by Dragon Voice Recognition software and as such may include  unintentional dictation errors.

## 2023-10-07 LAB — CBC WITH DIFFERENTIAL/PLATELET
Basophils Absolute: 0 10*3/uL (ref 0.0–0.2)
Basos: 0 %
EOS (ABSOLUTE): 0.1 10*3/uL (ref 0.0–0.4)
Eos: 1 %
Hematocrit: 52.7 % — ABNORMAL HIGH (ref 34.0–46.6)
Hemoglobin: 17.3 g/dL — ABNORMAL HIGH (ref 11.1–15.9)
Immature Grans (Abs): 0 10*3/uL (ref 0.0–0.1)
Immature Granulocytes: 0 %
Lymphocytes Absolute: 2.9 10*3/uL (ref 0.7–3.1)
Lymphs: 29 %
MCH: 31.4 pg (ref 26.6–33.0)
MCHC: 32.8 g/dL (ref 31.5–35.7)
MCV: 96 fL (ref 79–97)
Monocytes Absolute: 0.6 10*3/uL (ref 0.1–0.9)
Monocytes: 6 %
Neutrophils Absolute: 6.4 10*3/uL (ref 1.4–7.0)
Neutrophils: 64 %
Platelets: 285 10*3/uL (ref 150–450)
RBC: 5.51 x10E6/uL — ABNORMAL HIGH (ref 3.77–5.28)
RDW: 15.8 % — ABNORMAL HIGH (ref 11.7–15.4)
WBC: 10 10*3/uL (ref 3.4–10.8)

## 2023-10-07 LAB — CMP14+EGFR
ALT: 13 IU/L (ref 0–32)
AST: 16 IU/L (ref 0–40)
Albumin: 4.4 g/dL (ref 3.8–4.8)
Alkaline Phosphatase: 47 IU/L (ref 44–121)
BUN/Creatinine Ratio: 16 (ref 12–28)
BUN: 16 mg/dL (ref 8–27)
Bilirubin Total: 0.6 mg/dL (ref 0.0–1.2)
CO2: 22 mmol/L (ref 20–29)
Calcium: 9.8 mg/dL (ref 8.7–10.3)
Chloride: 105 mmol/L (ref 96–106)
Creatinine, Ser: 1.01 mg/dL — ABNORMAL HIGH (ref 0.57–1.00)
Globulin, Total: 2.5 g/dL (ref 1.5–4.5)
Glucose: 96 mg/dL (ref 70–99)
Potassium: 3.7 mmol/L (ref 3.5–5.2)
Sodium: 143 mmol/L (ref 134–144)
Total Protein: 6.9 g/dL (ref 6.0–8.5)
eGFR: 56 mL/min/{1.73_m2} — ABNORMAL LOW (ref >59)

## 2023-10-07 LAB — HEMOGLOBIN A1C
Est. average glucose Bld gHb Est-mCnc: 169 mg/dL
Hgb A1c MFr Bld: 7.5 % — ABNORMAL HIGH (ref 4.8–5.6)

## 2023-10-07 LAB — LIPID PANEL
Chol/HDL Ratio: 1.9 ratio (ref 0.0–4.4)
Cholesterol, Total: 143 mg/dL (ref 100–199)
HDL: 74 mg/dL (ref >39)
LDL Chol Calc (NIH): 59 mg/dL (ref 0–99)
Triglycerides: 42 mg/dL (ref 0–149)
VLDL Cholesterol Cal: 10 mg/dL (ref 5–40)

## 2023-10-17 ENCOUNTER — Encounter: Payer: Self-pay | Admitting: Podiatry

## 2023-10-17 ENCOUNTER — Other Ambulatory Visit: Payer: Self-pay | Admitting: Podiatry

## 2023-10-17 DIAGNOSIS — M86171 Other acute osteomyelitis, right ankle and foot: Secondary | ICD-10-CM | POA: Diagnosis not present

## 2023-10-17 DIAGNOSIS — M2041 Other hammer toe(s) (acquired), right foot: Secondary | ICD-10-CM | POA: Diagnosis not present

## 2023-10-17 DIAGNOSIS — L859 Epidermal thickening, unspecified: Secondary | ICD-10-CM | POA: Diagnosis not present

## 2023-10-17 DIAGNOSIS — S93149A Subluxation of metatarsophalangeal joint of unspecified toe(s), initial encounter: Secondary | ICD-10-CM | POA: Diagnosis not present

## 2023-10-17 MED ORDER — IBUPROFEN 800 MG PO TABS: 800.0000 mg | ORAL_TABLET | Freq: Four times a day (QID) | ORAL | 1 refills | Status: DC | PRN

## 2023-10-17 MED ORDER — OXYCODONE-ACETAMINOPHEN 5-325 MG PO TABS: 1.0000 | ORAL_TABLET | ORAL | 0 refills | Status: AC | PRN

## 2023-10-17 MED ORDER — RIVAROXABAN 10 MG PO TABS
10.0000 mg | ORAL_TABLET | Freq: Every day | ORAL | 0 refills | Status: DC
Start: 2023-10-17 — End: 2023-10-25

## 2023-10-25 ENCOUNTER — Ambulatory Visit (INDEPENDENT_AMBULATORY_CARE_PROVIDER_SITE_OTHER): Admitting: Cardiovascular Disease

## 2023-10-25 ENCOUNTER — Encounter: Payer: Self-pay | Admitting: Cardiovascular Disease

## 2023-10-25 ENCOUNTER — Ambulatory Visit (INDEPENDENT_AMBULATORY_CARE_PROVIDER_SITE_OTHER): Admitting: Podiatry

## 2023-10-25 ENCOUNTER — Ambulatory Visit (INDEPENDENT_AMBULATORY_CARE_PROVIDER_SITE_OTHER)

## 2023-10-25 VITALS — BP 120/80 | HR 72 | Ht 66.0 in | Wt 186.8 lb

## 2023-10-25 DIAGNOSIS — Z89421 Acquired absence of other right toe(s): Secondary | ICD-10-CM

## 2023-10-25 DIAGNOSIS — E782 Mixed hyperlipidemia: Secondary | ICD-10-CM | POA: Diagnosis not present

## 2023-10-25 DIAGNOSIS — N1831 Chronic kidney disease, stage 3a: Secondary | ICD-10-CM | POA: Diagnosis not present

## 2023-10-25 DIAGNOSIS — E663 Overweight: Secondary | ICD-10-CM | POA: Diagnosis not present

## 2023-10-25 DIAGNOSIS — I4891 Unspecified atrial fibrillation: Secondary | ICD-10-CM

## 2023-10-25 DIAGNOSIS — I10 Essential (primary) hypertension: Secondary | ICD-10-CM

## 2023-10-25 MED ORDER — APIXABAN 5 MG PO TABS: 5.0000 mg | ORAL_TABLET | Freq: Two times a day (BID) | ORAL | 2 refills | Status: AC

## 2023-10-25 NOTE — Progress Notes (Signed)
 Subjective:  Patient ID: [name] [name], female    DOB: [birth year],  MRN: [id number]  Chief Complaint  Patient presents with   Routine Post Op    DOS 10/17/23 RT 2ND TOE MPJ AMPUTATION    DOS: 10/17/2023 Procedure: Right second digit toe amputation  83 y.o. female returns for post-op check.  Patient states she is doing well denies any other acute complaints bandages clean dry and intact pain is controlled  Review of Systems: Negative except as noted in the HPI. Denies N/V/F/Ch.  Past Medical History:  Diagnosis Date   GERD (gastroesophageal reflux disease)    Hypertension     Current Outpatient Medications:    Alcohol Swabs (DROPSAFE ALCOHOL PREP) 70 % PADS, , Disp: , Rfl:    amLODipine  (NORVASC ) 5 MG tablet, TAKE 1 TABLET BY MOUTH DAILY, Disp: 100 tablet, Rfl: 2   apixaban (ELIQUIS) 5 MG TABS tablet, Take 1 tablet (5 mg total) by mouth 2 (two) times daily., Disp: 60 tablet, Rfl: 2   buPROPion (WELLBUTRIN XL) 150 MG 24 hr tablet, Take 1 tablet by mouth daily., Disp: , Rfl:    chlorthalidone  (HYGROTON ) 25 MG tablet, TAKE 1 TABLET BY MOUTH DAILY, Disp: 100 tablet, Rfl: 2   Cholecalciferol (VITAMIN D3) 25 MCG (1000 UT) CAPS, Take by mouth., Disp: , Rfl:    ciclopirox  (PENLAC ) 8 % solution, Apply topically at bedtime. Apply over nail and surrounding skin. Apply daily over previous coat. After seven (7) days, may remove with alcohol and continue cycle., Disp: 6.6 mL, Rfl: 0   clotrimazole -betamethasone  (LOTRISONE ) cream, Apply 1 Application topically daily., Disp: 30 g, Rfl: 0   gabapentin  (NEURONTIN ) 400 MG capsule, TAKE 1 CAPSULE BY MOUTH AT  BEDTIME, Disp: 90 capsule, Rfl: 3   ibuprofen  (ADVIL ) 800 MG tablet, TAKE 1 TABLET BY MOUTH DAILY, Disp: 100 tablet, Rfl: 2   ibuprofen  (ADVIL ) 800 MG tablet, Take 1 tablet (800 mg total) by mouth every 6 (six) hours as needed., Disp: 60 tablet, Rfl: 1   memantine  (NAMENDA ) 5 MG tablet, TAKE 1 TABLET BY MOUTH DAILY, Disp: 100 tablet, Rfl: 2    metFORMIN  (GLUCOPHAGE ) 500 MG tablet, Take 1 tablet (500 mg total) by mouth 2 (two) times daily with a meal., Disp: 180 tablet, Rfl: 3   omeprazole  (PRILOSEC) 40 MG capsule, Take 1 capsule (40 mg total) by mouth daily., Disp: 90 capsule, Rfl: 3   oxyCODONE -acetaminophen  (PERCOCET) 5-325 MG tablet, Take 1 tablet by mouth every 4 (four) hours as needed for severe pain (pain score 7-10)., Disp: 30 tablet, Rfl: 0   potassium chloride  SA (KLOR-CON  M) 20 MEQ tablet, TAKE 1 TABLET BY MOUTH DAILY, Disp: 100 tablet, Rfl: 2   simvastatin  (ZOCOR ) 20 MG tablet, Take 1 tablet (20 mg total) by mouth daily., Disp: 90 tablet, Rfl: 3   TRUE METRIX BLOOD GLUCOSE TEST test strip, 1 each by Other route., Disp: , Rfl:    TRUEplus Lancets 33G MISC, , Disp: , Rfl:   Social History   Tobacco Use  Smoking Status Former  Smokeless Tobacco Never    No Known Allergies Objective:  There were no vitals filed for this visit. There is no height or weight on file to calculate BMI. Constitutional Well developed. Well nourished.  Vascular Foot warm and well perfused. Capillary refill normal to all digits.   Neurologic Normal speech. Oriented to person, place, and time. Epicritic sensation to light touch grossly present bilaterally.  Dermatologic Skin healing well without signs of infection.  Skin edges well coapted without signs of infection.  Orthopedic: Tenderness to palpation noted about the surgical site.   Radiographs: 3 views of skeletally mature the right second toe: Interval toe amputation noted no complication noted no signs of osteomyelitis noted Assessment:   1. History of amputation of right second toe [facility])    Plan:  Patient was evaluated and treated and all questions answered.  S/p foot surgery right -Progressing as expected post-operatively. -XR: See above -WB Status: Weightbearing as tolerated in surgical shoe -Sutures: Intact.  No clinical signs of Deis and no no complication  noted. -Medications: None -Foot redressed.  No follow-ups on file.

## 2023-10-25 NOTE — Progress Notes (Signed)
 Cardiology Office Note   Date:  10/25/2023   ID:  [name], [name] [birth year], MRN [id number]  PCP:  [name], NP  Cardiologist:  Debborah Fairly, MD      History of Present Illness: [name] [name] is a 83 y.o. female who presents for  Chief Complaint  Patient presents with   Follow-up    Consult    8 YOAF presents as has new onset atrial fibrillation. She was seen by podiatrist and anesthesiologist recommended evaluation as had Afib. Denies chest pain ,SOB or dizziness.      Past Medical History:  Diagnosis Date   GERD (gastroesophageal reflux disease)    Hypertension      History reviewed. No pertinent surgical history.   Current Outpatient Medications  Medication Sig Dispense Refill   Alcohol Swabs (DROPSAFE ALCOHOL PREP) 70 % PADS      amLODipine  (NORVASC ) 5 MG tablet TAKE 1 TABLET BY MOUTH DAILY 100 tablet 2   apixaban (ELIQUIS) 5 MG TABS tablet Take 1 tablet (5 mg total) by mouth 2 (two) times daily. 60 tablet 2   buPROPion (WELLBUTRIN XL) 150 MG 24 hr tablet Take 1 tablet by mouth daily.     chlorthalidone  (HYGROTON ) 25 MG tablet TAKE 1 TABLET BY MOUTH DAILY 100 tablet 2   Cholecalciferol (VITAMIN D3) 25 MCG (1000 UT) CAPS Take by mouth.     ciclopirox  (PENLAC ) 8 % solution Apply topically at bedtime. Apply over nail and surrounding skin. Apply daily over previous coat. After seven (7) days, may remove with alcohol and continue cycle. 6.6 mL 0   clotrimazole -betamethasone  (LOTRISONE ) cream Apply 1 Application topically daily. 30 g 0   gabapentin  (NEURONTIN ) 400 MG capsule TAKE 1 CAPSULE BY MOUTH AT  BEDTIME 90 capsule 3   ibuprofen  (ADVIL ) 800 MG tablet TAKE 1 TABLET BY MOUTH DAILY 100 tablet 2   ibuprofen  (ADVIL ) 800 MG tablet Take 1 tablet (800 mg total) by mouth every 6 (six) hours as needed. 60 tablet 1   memantine  (NAMENDA ) 5 MG tablet TAKE 1 TABLET BY MOUTH DAILY 100 tablet 2   metFORMIN  (GLUCOPHAGE ) 500 MG tablet Take 1 tablet (500 mg total) by  mouth 2 (two) times daily with a meal. 180 tablet 3   omeprazole  (PRILOSEC) 40 MG capsule Take 1 capsule (40 mg total) by mouth daily. 90 capsule 3   oxyCODONE -acetaminophen  (PERCOCET) 5-325 MG tablet Take 1 tablet by mouth every 4 (four) hours as needed for severe pain (pain score 7-10). 30 tablet 0   potassium chloride  SA (KLOR-CON  M) 20 MEQ tablet TAKE 1 TABLET BY MOUTH DAILY 100 tablet 2   simvastatin  (ZOCOR ) 20 MG tablet Take 1 tablet (20 mg total) by mouth daily. 90 tablet 3   TRUE METRIX BLOOD GLUCOSE TEST test strip 1 each by Other route.     TRUEplus Lancets 33G MISC      No current facility-administered medications for this visit.    Allergies:   Patient has no known allergies.    Social History:   reports that she has quit smoking. She has never used smokeless tobacco. She reports current alcohol use. She reports that she does not currently use drugs.   Family History:  family history is not on file.    ROS:     Review of Systems  Constitutional: Negative.   HENT: Negative.    Eyes: Negative.   Respiratory: Negative.    Gastrointestinal: Negative.   Genitourinary: Negative.  Musculoskeletal: Negative.   Skin: Negative.   Neurological: Negative.   Endo/Heme/Allergies: Negative.   Psychiatric/Behavioral: Negative.    All other systems reviewed and are negative.     All other systems are reviewed and negative.    PHYSICAL EXAM: VS:  BP 120/80   Pulse 72   Ht 5\' 6"  (1.676 m)   Wt 186 lb 12.8 oz (84.7 kg)   SpO2 96%   BMI 30.15 kg/m  , BMI Body mass index is 30.15 kg/m. Last weight:  Wt Readings from Last 3 Encounters:  10/25/23 186 lb 12.8 oz (84.7 kg)  10/06/23 185 lb (83.9 kg)  06/09/23 193 lb 6.4 oz (87.7 kg)     Physical Exam Constitutional:      Appearance: Normal appearance.  Cardiovascular:     Rate and Rhythm: Normal rate and regular rhythm.     Heart sounds: Normal heart sounds.  Pulmonary:     Effort: Pulmonary effort is normal.      Breath sounds: Normal breath sounds.  Musculoskeletal:     Right lower leg: No edema.     Left lower leg: No edema.  Neurological:     Mental Status: She is alert.       EKG:   Recent Labs: 06/03/2023: TSH 1.060 10/06/2023: ALT 13; BUN 16; Creatinine, Ser 1.01; Hemoglobin 17.3; Platelets 285; Potassium 3.7; Sodium 143    Lipid Panel    Component Value Date/Time   CHOL 143 10/06/2023 1003   TRIG 42 10/06/2023 1003   HDL 74 10/06/2023 1003   CHOLHDL 1.9 10/06/2023 1003   LDLCALC 59 10/06/2023 1003      Other studies Reviewed: Additional studies/ records that were reviewed today include:  Review of the above records demonstrates:       No data to display            ASSESSMENT AND PLAN:    ICD-10-CM   1. Mixed hyperlipidemia  E78.2 PCV ECHOCARDIOGRAM COMPLETE    MYOCARDIAL PERFUSION IMAGING    apixaban (ELIQUIS) 5 MG TABS tablet    2. Hypertension, essential  I10 PCV ECHOCARDIOGRAM COMPLETE    MYOCARDIAL PERFUSION IMAGING    apixaban (ELIQUIS) 5 MG TABS tablet    3. Atrial fibrillation, unspecified type (HCC)  I48.91 PCV ECHOCARDIOGRAM COMPLETE    MYOCARDIAL PERFUSION IMAGING    apixaban (ELIQUIS) 5 MG TABS tablet   set up, echo stress test, Afib with controlled rate, add elliquis for Stroke prevention    4. Overweight  E66.3 PCV ECHOCARDIOGRAM COMPLETE    MYOCARDIAL PERFUSION IMAGING    apixaban (ELIQUIS) 5 MG TABS tablet    5. Stage 3a chronic kidney disease (CKD) (HCC)  N18.31 PCV ECHOCARDIOGRAM COMPLETE    MYOCARDIAL PERFUSION IMAGING    apixaban (ELIQUIS) 5 MG TABS tablet       Problem List Items Addressed This Visit       Cardiovascular and Mediastinum   Hypertension, essential   Relevant Medications   apixaban (ELIQUIS) 5 MG TABS tablet   Other Relevant Orders   PCV ECHOCARDIOGRAM COMPLETE   MYOCARDIAL PERFUSION IMAGING     Genitourinary   Stage 3a chronic kidney disease (CKD) (HCC)   Relevant Medications   apixaban (ELIQUIS) 5 MG  TABS tablet   Other Relevant Orders   PCV ECHOCARDIOGRAM COMPLETE   MYOCARDIAL PERFUSION IMAGING     Other   Overweight   Relevant Medications   apixaban (ELIQUIS) 5 MG TABS tablet   Other Relevant Orders  PCV ECHOCARDIOGRAM COMPLETE   MYOCARDIAL PERFUSION IMAGING   Hyperlipemia - Primary   Relevant Medications   apixaban (ELIQUIS) 5 MG TABS tablet   Other Relevant Orders   PCV ECHOCARDIOGRAM COMPLETE   MYOCARDIAL PERFUSION IMAGING   Other Visit Diagnoses       Atrial fibrillation, unspecified type (HCC)       set up, echo stress test, Afib with controlled rate, add elliquis for Stroke prevention   Relevant Medications   apixaban (ELIQUIS) 5 MG TABS tablet   Other Relevant Orders   PCV ECHOCARDIOGRAM COMPLETE   MYOCARDIAL PERFUSION IMAGING          Disposition:   Return in about 4 weeks (around 11/22/2023) for echo, stress test and f/u.    Total time spent: 50 minutes  Signed,  Debborah Fairly, MD  10/25/2023 11:49 AM    [facility]

## 2023-11-07 ENCOUNTER — Other Ambulatory Visit: Payer: Self-pay

## 2023-11-07 MED ORDER — TRUE METRIX METER W/DEVICE KIT: PACK | 0 refills | Status: AC

## 2023-11-07 MED ORDER — TRUE METRIX BLOOD GLUCOSE TEST VI STRP
ORAL_STRIP | 3 refills | Status: AC
Start: 2023-11-07 — End: ?

## 2023-11-07 MED ORDER — TRUEPLUS LANCETS 33G MISC: 3 refills | Status: AC

## 2023-11-08 ENCOUNTER — Encounter: Admitting: Podiatry

## 2023-11-08 ENCOUNTER — Ambulatory Visit (INDEPENDENT_AMBULATORY_CARE_PROVIDER_SITE_OTHER): Admitting: Podiatry

## 2023-11-08 DIAGNOSIS — Z89421 Acquired absence of other right toe(s): Secondary | ICD-10-CM

## 2023-11-08 MED ORDER — CLOTRIMAZOLE-BETAMETHASONE 1-0.05 % EX CREA: 1.0000 | TOPICAL_CREAM | Freq: Every day | CUTANEOUS | 0 refills | Status: DC

## 2023-11-08 MED ORDER — AMMONIUM LACTATE 12 % EX LOTN: 1.0000 | TOPICAL_LOTION | CUTANEOUS | 0 refills | Status: AC | PRN

## 2023-11-08 NOTE — Progress Notes (Signed)
 Subjective:  Patient ID: [name] [name], female    DOB: 1940/11/15,  MRN: [id number]  Chief Complaint  Patient presents with   Routine Post Op    POV # 2 DOS 10/17/23 RT 2ND TOE MPJ AMPUTATION    DOS: 10/17/2023 Procedure: Right second digit toe amputation  83 y.o. female returns for post-op check.  Patient states she is doing well denies any other acute complaints bandages clean dry and intact pain is controlled  Review of Systems: Negative except as noted in the HPI. Denies N/V/F/Ch.  Past Medical History:  Diagnosis Date   GERD (gastroesophageal reflux disease)    Hypertension     Current Outpatient Medications:    ammonium lactate (AMLACTIN DAILY) 12 % lotion, Apply 1 Application topically as needed., Disp: 400 g, Rfl: 0   clotrimazole -betamethasone  (LOTRISONE ) cream, Apply 1 Application topically daily., Disp: 30 g, Rfl: 0   Alcohol Swabs (DROPSAFE ALCOHOL PREP) 70 % PADS, , Disp: , Rfl:    amLODipine  (NORVASC ) 5 MG tablet, TAKE 1 TABLET BY MOUTH DAILY, Disp: 100 tablet, Rfl: 2   apixaban  (ELIQUIS ) 5 MG TABS tablet, Take 1 tablet (5 mg total) by mouth 2 (two) times daily., Disp: 60 tablet, Rfl: 2   Blood Glucose Monitoring Suppl (TRUE METRIX METER) w/Device KIT, Use device to check sugars twice daily, Disp: 1 kit, Rfl: 0   buPROPion (WELLBUTRIN XL) 150 MG 24 hr tablet, Take 1 tablet by mouth daily., Disp: , Rfl:    chlorthalidone  (HYGROTON ) 25 MG tablet, TAKE 1 TABLET BY MOUTH DAILY, Disp: 100 tablet, Rfl: 2   Cholecalciferol (VITAMIN D3) 25 MCG (1000 UT) CAPS, Take by mouth., Disp: , Rfl:    ciclopirox  (PENLAC ) 8 % solution, Apply topically at bedtime. Apply over nail and surrounding skin. Apply daily over previous coat. After seven (7) days, may remove with alcohol and continue cycle., Disp: 6.6 mL, Rfl: 0   clotrimazole -betamethasone  (LOTRISONE ) cream, Apply 1 Application topically daily., Disp: 30 g, Rfl: 0   gabapentin  (NEURONTIN ) 400 MG capsule, TAKE 1 CAPSULE BY MOUTH AT   BEDTIME, Disp: 90 capsule, Rfl: 3   ibuprofen  (ADVIL ) 800 MG tablet, TAKE 1 TABLET BY MOUTH DAILY, Disp: 100 tablet, Rfl: 2   ibuprofen  (ADVIL ) 800 MG tablet, Take 1 tablet (800 mg total) by mouth every 6 (six) hours as needed., Disp: 60 tablet, Rfl: 1   memantine  (NAMENDA ) 5 MG tablet, TAKE 1 TABLET BY MOUTH DAILY, Disp: 100 tablet, Rfl: 2   metFORMIN  (GLUCOPHAGE ) 500 MG tablet, Take 1 tablet (500 mg total) by mouth 2 (two) times daily with a meal., Disp: 180 tablet, Rfl: 3   omeprazole  (PRILOSEC) 40 MG capsule, Take 1 capsule (40 mg total) by mouth daily., Disp: 90 capsule, Rfl: 3   oxyCODONE -acetaminophen  (PERCOCET) 5-325 MG tablet, Take 1 tablet by mouth every 4 (four) hours as needed for severe pain (pain score 7-10)., Disp: 30 tablet, Rfl: 0   potassium chloride  SA (KLOR-CON  M) 20 MEQ tablet, TAKE 1 TABLET BY MOUTH DAILY, Disp: 100 tablet, Rfl: 2   simvastatin  (ZOCOR ) 20 MG tablet, Take 1 tablet (20 mg total) by mouth daily., Disp: 90 tablet, Rfl: 3   TRUE METRIX BLOOD GLUCOSE TEST test strip, Use with device to  check sugars twice daily, Disp: 100 each, Rfl: 3   TRUEplus Lancets 33G MISC, Use with device to check sugars twice daily, Disp: 100 each, Rfl: 3  Social History   Tobacco Use  Smoking Status Former  Smokeless Tobacco  Never    No Known Allergies Objective:  There were no vitals filed for this visit. There is no height or weight on file to calculate BMI. Constitutional Well developed. Well nourished.  Vascular Foot warm and well perfused. Capillary refill normal to all digits.   Neurologic Normal speech. Oriented to person, place, and time. Epicritic sensation to light touch grossly present bilaterally.  Dermatologic Skin completely epithelialized.  No signs of wound noted.  Reduction of deformity noted.  Orthopedic: No further tenderness to palpation noted about the surgical site.   Radiographs: 3 views of skeletally mature the right second toe: Interval toe amputation  noted no complication noted no signs of osteomyelitis noted Assessment:   1. History of amputation of right second toe [facility])     Plan:  Patient was evaluated and treated and all questions answered.  S/p foot surgery right - Clinically healed and officially discharged from my care.  At this time I discussed shoe gear modification and protection.  If any foot and ankle issues on future she will come back and see me.  Reduction of deformity noted after amputation  No follow-ups on file.

## 2023-11-10 ENCOUNTER — Ambulatory Visit

## 2023-11-10 DIAGNOSIS — I10 Essential (primary) hypertension: Secondary | ICD-10-CM

## 2023-11-10 DIAGNOSIS — E663 Overweight: Secondary | ICD-10-CM

## 2023-11-10 DIAGNOSIS — I4891 Unspecified atrial fibrillation: Secondary | ICD-10-CM

## 2023-11-10 DIAGNOSIS — N1831 Chronic kidney disease, stage 3a: Secondary | ICD-10-CM

## 2023-11-10 DIAGNOSIS — E782 Mixed hyperlipidemia: Secondary | ICD-10-CM

## 2023-11-10 MED ORDER — TECHNETIUM TC 99M SESTAMIBI GENERIC - CARDIOLITE
10.0000 | Freq: Once | INTRAVENOUS | Status: AC | PRN
  Administered 2023-11-10: 10 via INTRAVENOUS

## 2023-11-10 MED ORDER — TECHNETIUM TC 99M SESTAMIBI GENERIC - CARDIOLITE
30.0000 | Freq: Once | INTRAVENOUS | Status: AC | PRN
  Administered 2023-11-10: 30 via INTRAVENOUS

## 2023-11-18 ENCOUNTER — Other Ambulatory Visit

## 2023-11-24 ENCOUNTER — Ambulatory Visit: Admitting: Cardiovascular Disease

## 2023-11-29 ENCOUNTER — Telehealth: Payer: 59

## 2023-12-02 ENCOUNTER — Ambulatory Visit (INDEPENDENT_AMBULATORY_CARE_PROVIDER_SITE_OTHER)

## 2023-12-02 DIAGNOSIS — I340 Nonrheumatic mitral (valve) insufficiency: Secondary | ICD-10-CM

## 2023-12-02 DIAGNOSIS — I371 Nonrheumatic pulmonary valve insufficiency: Secondary | ICD-10-CM

## 2023-12-02 DIAGNOSIS — I4891 Unspecified atrial fibrillation: Secondary | ICD-10-CM

## 2023-12-02 DIAGNOSIS — E663 Overweight: Secondary | ICD-10-CM

## 2023-12-02 DIAGNOSIS — I361 Nonrheumatic tricuspid (valve) insufficiency: Secondary | ICD-10-CM | POA: Diagnosis not present

## 2023-12-02 DIAGNOSIS — I351 Nonrheumatic aortic (valve) insufficiency: Secondary | ICD-10-CM

## 2023-12-02 DIAGNOSIS — N1831 Chronic kidney disease, stage 3a: Secondary | ICD-10-CM

## 2023-12-02 DIAGNOSIS — I10 Essential (primary) hypertension: Secondary | ICD-10-CM

## 2023-12-02 DIAGNOSIS — E782 Mixed hyperlipidemia: Secondary | ICD-10-CM

## 2023-12-06 ENCOUNTER — Encounter: Payer: Self-pay | Admitting: Cardiovascular Disease

## 2023-12-06 ENCOUNTER — Ambulatory Visit (INDEPENDENT_AMBULATORY_CARE_PROVIDER_SITE_OTHER): Admitting: Cardiovascular Disease

## 2023-12-06 VITALS — BP 120/80 | HR 51 | Ht 66.0 in | Wt 184.8 lb

## 2023-12-06 DIAGNOSIS — I351 Nonrheumatic aortic (valve) insufficiency: Secondary | ICD-10-CM | POA: Diagnosis not present

## 2023-12-06 DIAGNOSIS — N1831 Chronic kidney disease, stage 3a: Secondary | ICD-10-CM

## 2023-12-06 DIAGNOSIS — R0602 Shortness of breath: Secondary | ICD-10-CM

## 2023-12-06 DIAGNOSIS — I4891 Unspecified atrial fibrillation: Secondary | ICD-10-CM

## 2023-12-06 DIAGNOSIS — I340 Nonrheumatic mitral (valve) insufficiency: Secondary | ICD-10-CM

## 2023-12-06 DIAGNOSIS — I10 Essential (primary) hypertension: Secondary | ICD-10-CM

## 2023-12-06 DIAGNOSIS — E782 Mixed hyperlipidemia: Secondary | ICD-10-CM | POA: Diagnosis not present

## 2023-12-06 MED ORDER — LOSARTAN POTASSIUM 25 MG PO TABS: 25.0000 mg | ORAL_TABLET | Freq: Every day | ORAL | 1 refills | Status: DC

## 2023-12-06 NOTE — Progress Notes (Signed)
 Cardiology Office Note   Date:  12/06/2023   ID:  [name] [name], DOB 23-Apr-1941, MRN [id number]  PCP:  [name], NP  Cardiologist:  Debborah Fairly, MD      History of Present Illness: [name] [name] is a 83 y.o. female who presents for  Chief Complaint  Patient presents with   Follow-up    1 month NST/ECHO results    Doing well      Past Medical History:  Diagnosis Date   GERD (gastroesophageal reflux disease)    Hypertension      History reviewed. No pertinent surgical history.   Current Outpatient Medications  Medication Sig Dispense Refill   Alcohol Swabs (DROPSAFE ALCOHOL PREP) 70 % PADS      amLODipine  (NORVASC ) 5 MG tablet TAKE 1 TABLET BY MOUTH DAILY 100 tablet 2   ammonium lactate  (AMLACTIN DAILY) 12 % lotion Apply 1 Application topically as needed. 400 g 0   apixaban  (ELIQUIS ) 5 MG TABS tablet Take 1 tablet (5 mg total) by mouth 2 (two) times daily. 60 tablet 2   Blood Glucose Monitoring Suppl (TRUE METRIX METER) w/Device KIT Use device to check sugars twice daily 1 kit 0   buPROPion (WELLBUTRIN XL) 150 MG 24 hr tablet Take 1 tablet by mouth daily.     chlorthalidone  (HYGROTON ) 25 MG tablet TAKE 1 TABLET BY MOUTH DAILY 100 tablet 2   Cholecalciferol (VITAMIN D3) 25 MCG (1000 UT) CAPS Take by mouth.     ciclopirox  (PENLAC ) 8 % solution Apply topically at bedtime. Apply over nail and surrounding skin. Apply daily over previous coat. After seven (7) days, may remove with alcohol and continue cycle. 6.6 mL 0   clotrimazole -betamethasone  (LOTRISONE ) cream Apply 1 Application topically daily. 30 g 0   clotrimazole -betamethasone  (LOTRISONE ) cream Apply 1 Application topically daily. 30 g 0   gabapentin  (NEURONTIN ) 400 MG capsule TAKE 1 CAPSULE BY MOUTH AT  BEDTIME 90 capsule 3   ibuprofen  (ADVIL ) 800 MG tablet TAKE 1 TABLET BY MOUTH DAILY 100 tablet 2   ibuprofen  (ADVIL ) 800 MG tablet Take 1 tablet (800 mg total) by mouth every 6 (six) hours as needed. 60  tablet 1   losartan (COZAAR) 25 MG tablet Take 1 tablet (25 mg total) by mouth daily. 90 tablet 1   memantine  (NAMENDA ) 5 MG tablet TAKE 1 TABLET BY MOUTH DAILY 100 tablet 2   metFORMIN  (GLUCOPHAGE ) 500 MG tablet Take 1 tablet (500 mg total) by mouth 2 (two) times daily with a meal. 180 tablet 3   omeprazole  (PRILOSEC) 40 MG capsule Take 1 capsule (40 mg total) by mouth daily. 90 capsule 3   oxyCODONE -acetaminophen  (PERCOCET) 5-325 MG tablet Take 1 tablet by mouth every 4 (four) hours as needed for severe pain (pain score 7-10). 30 tablet 0   potassium chloride  SA (KLOR-CON  M) 20 MEQ tablet TAKE 1 TABLET BY MOUTH DAILY 100 tablet 2   simvastatin  (ZOCOR ) 20 MG tablet Take 1 tablet (20 mg total) by mouth daily. 90 tablet 3   TRUE METRIX BLOOD GLUCOSE TEST test strip Use with device to  check sugars twice daily 100 each 3   TRUEplus Lancets 33G MISC Use with device to check sugars twice daily 100 each 3   No current facility-administered medications for this visit.    Allergies:   Patient has no known allergies.    Social History:   reports that she has quit smoking. She has never used smokeless tobacco. She  reports current alcohol use. She reports that she does not currently use drugs.   Family History:  family history is not on file.    ROS:     Review of Systems  Constitutional: Negative.   HENT: Negative.    Eyes: Negative.   Respiratory: Negative.    Gastrointestinal: Negative.   Genitourinary: Negative.   Musculoskeletal: Negative.   Skin: Negative.   Neurological: Negative.   Endo/Heme/Allergies: Negative.   Psychiatric/Behavioral: Negative.    All other systems reviewed and are negative.     All other systems are reviewed and negative.    PHYSICAL EXAM: VS:  BP 120/80   Pulse (!) 51   Ht 5' 6 (1.676 m)   Wt 184 lb 12.8 oz (83.8 kg)   SpO2 98%   BMI 29.83 kg/m  , BMI Body mass index is 29.83 kg/m. Last weight:  Wt Readings from Last 3 Encounters:  12/06/23  184 lb 12.8 oz (83.8 kg)  10/25/23 186 lb 12.8 oz (84.7 kg)  10/06/23 185 lb (83.9 kg)     Physical Exam Constitutional:      Appearance: Normal appearance.   Cardiovascular:     Rate and Rhythm: Normal rate and regular rhythm.     Heart sounds: Normal heart sounds.  Pulmonary:     Effort: Pulmonary effort is normal.     Breath sounds: Normal breath sounds.   Musculoskeletal:     Right lower leg: No edema.     Left lower leg: No edema.   Neurological:     Mental Status: She is alert.       EKG:   Recent Labs: 06/03/2023: TSH 1.060 10/06/2023: ALT 13; BUN 16; Creatinine, Ser 1.01; Hemoglobin 17.3; Platelets 285; Potassium 3.7; Sodium 143    Lipid Panel    Component Value Date/Time   CHOL 143 10/06/2023 1003   TRIG 42 10/06/2023 1003   HDL 74 10/06/2023 1003   CHOLHDL 1.9 10/06/2023 1003   LDLCALC 59 10/06/2023 1003      Other studies Reviewed: Additional studies/ records that were reviewed today include:  Review of the above records demonstrates:       No data to display            ASSESSMENT AND PLAN:    ICD-10-CM   1. Mixed hyperlipidemia  E78.2 losartan (COZAAR) 25 MG tablet    2. Hypertension, essential  I10 losartan (COZAAR) 25 MG tablet    3. Atrial fibrillation, unspecified type (HCC)  I48.91 losartan (COZAAR) 25 MG tablet    4. Stage 3a chronic kidney disease (CKD) (HCC)  N18.31 losartan (COZAAR) 25 MG tablet    5. SOB (shortness of breath)  R06.02 losartan (COZAAR) 25 MG tablet   Creat 1.1, add losartan 25 as has Modereate MR, mild AR and LVEF borderline 50%. Has only DOE. Stress test had no ischaemia.    6. Nonrheumatic aortic valve insufficiency  I35.1 losartan (COZAAR) 25 MG tablet   Mild AR, mild AS LVEF 50%    7. Nonrheumatic mitral valve regurgitation  I34.0 losartan (COZAAR) 25 MG tablet   Moderate MR       Problem List Items Addressed This Visit       Cardiovascular and Mediastinum   Hypertension, essential   Relevant  Medications   losartan (COZAAR) 25 MG tablet     Genitourinary   Stage 3a chronic kidney disease (CKD) (HCC)   Relevant Medications   losartan (COZAAR) 25 MG tablet  Other   Hyperlipemia - Primary   Relevant Medications   losartan (COZAAR) 25 MG tablet   Other Visit Diagnoses       Atrial fibrillation, unspecified type (HCC)       Relevant Medications   losartan (COZAAR) 25 MG tablet     SOB (shortness of breath)       Creat 1.1, add losartan 25 as has Modereate MR, mild AR and LVEF borderline 50%. Has only DOE. Stress test had no ischaemia.   Relevant Medications   losartan (COZAAR) 25 MG tablet     Nonrheumatic aortic valve insufficiency       Mild AR, mild AS LVEF 50%   Relevant Medications   losartan (COZAAR) 25 MG tablet     Nonrheumatic mitral valve regurgitation       Moderate MR   Relevant Medications   losartan (COZAAR) 25 MG tablet          Disposition:   Return in about 2 months (around 02/05/2024).    Total time spent: 30 minutes  Signed,  Debborah Fairly, MD  12/06/2023 10:33 AM    [facility]

## 2023-12-23 ENCOUNTER — Other Ambulatory Visit: Payer: Self-pay | Admitting: Family

## 2023-12-28 DIAGNOSIS — M1712 Unilateral primary osteoarthritis, left knee: Secondary | ICD-10-CM | POA: Diagnosis not present

## 2023-12-28 DIAGNOSIS — M1711 Unilateral primary osteoarthritis, right knee: Secondary | ICD-10-CM | POA: Diagnosis not present

## 2024-01-20 ENCOUNTER — Other Ambulatory Visit: Payer: Self-pay | Admitting: Cardiology

## 2024-02-07 ENCOUNTER — Ambulatory Visit (INDEPENDENT_AMBULATORY_CARE_PROVIDER_SITE_OTHER): Admitting: Cardiology

## 2024-02-07 ENCOUNTER — Encounter: Payer: Self-pay | Admitting: Cardiology

## 2024-02-07 VITALS — BP 120/70 | HR 96 | Ht 66.0 in | Wt 188.2 lb

## 2024-02-07 DIAGNOSIS — R7303 Prediabetes: Secondary | ICD-10-CM

## 2024-02-07 DIAGNOSIS — Z1329 Encounter for screening for other suspected endocrine disorder: Secondary | ICD-10-CM | POA: Diagnosis not present

## 2024-02-07 DIAGNOSIS — I10 Essential (primary) hypertension: Secondary | ICD-10-CM

## 2024-02-07 DIAGNOSIS — E782 Mixed hyperlipidemia: Secondary | ICD-10-CM | POA: Diagnosis not present

## 2024-02-07 NOTE — Progress Notes (Signed)
 Established Patient Office Visit  Subjective:  Patient ID: [name] [name], female    DOB: 1940/11/19  Age: 83 y.o. MRN: [id number]  Chief Complaint  Patient presents with   Follow-up    4 month follow up    Patient in office for 4 month follow up. Did not have lab work done. Fasting today.  Patient doing well, no complaints today. Continue same medications.     No other concerns at this time.   Past Medical History:  Diagnosis Date   GERD (gastroesophageal reflux disease)    Hypertension     History reviewed. No pertinent surgical history.  Social History   Socioeconomic History   Marital status: Married    Spouse name: Not on file   Number of children: Not on file   Years of education: Not on file   Highest education level: Not on file  Occupational History   Not on file  Tobacco Use   Smoking status: Former   Smokeless tobacco: Never  Substance and Sexual Activity   Alcohol use: Yes    Comment: occ   Drug use: Not Currently   Sexual activity: Not Currently  Other Topics Concern   Not on file  Social History Narrative   Not on file   Social Drivers of Health   Financial Resource Strain: Low Risk  (12/28/2023)   Received from [facility]   Overall Financial Resource Strain (CARDIA)    Difficulty of Paying Living Expenses: Not very hard  Food Insecurity: No Food Insecurity (12/28/2023)   Received from [facility]   Hunger Vital Sign    Within the past 12 months, you worried that your food would run out before you got the money to buy more.: Never true    Within the past 12 months, the food you bought just didn't last and you didn't have money to get more.: Never true  Transportation Needs: No Transportation Needs (12/28/2023)   Received from [facility] - Transportation    In the past 12 months, has lack of transportation kept you from medical appointments or from getting medications?: No     Lack of Transportation (Non-Medical): No  Physical Activity: Not on file  Stress: Not on file  Social Connections: Not on file  Intimate Partner Violence: Not on file    Family History  Problem Relation Age of Onset   Breast cancer Neg Hx     No Known Allergies  Outpatient Medications Prior to Visit  Medication Sig   Alcohol Swabs (DROPSAFE ALCOHOL PREP) 70 % PADS    amLODipine  (NORVASC ) 5 MG tablet TAKE 1 TABLET BY MOUTH DAILY   ammonium lactate  (AMLACTIN DAILY) 12 % lotion Apply 1 Application topically as needed.   apixaban  (ELIQUIS ) 5 MG TABS tablet Take 1 tablet (5 mg total) by mouth 2 (two) times daily.   Blood Glucose Monitoring Suppl (TRUE METRIX METER) w/Device KIT Use device to check sugars twice daily   buPROPion (WELLBUTRIN XL) 150 MG 24 hr tablet Take 1 tablet by mouth daily.   chlorthalidone  (HYGROTON ) 25 MG tablet TAKE 1 TABLET BY MOUTH DAILY   Cholecalciferol (VITAMIN D3) 25 MCG (1000 UT) CAPS Take by mouth.   clotrimazole -betamethasone  (LOTRISONE ) cream Apply 1 Application topically daily.   clotrimazole -betamethasone  (LOTRISONE ) cream Apply 1 Application topically daily.   gabapentin  (NEURONTIN ) 400 MG capsule TAKE 1 CAPSULE BY MOUTH AT  BEDTIME   ibuprofen  (ADVIL ) 800 MG tablet  Take 1 tablet (800 mg total) by mouth every 6 (six) hours as needed.   losartan  (COZAAR ) 25 MG tablet Take 1 tablet (25 mg total) by mouth daily.   memantine  (NAMENDA ) 5 MG tablet TAKE 1 TABLET BY MOUTH DAILY   metFORMIN  (GLUCOPHAGE ) 500 MG tablet TAKE 1 TABLET BY MOUTH TWICE  DAILY WITH MEALS   omeprazole  (PRILOSEC) 40 MG capsule TAKE 1 CAPSULE BY MOUTH DAILY   oxyCODONE -acetaminophen  (PERCOCET) 5-325 MG tablet Take 1 tablet by mouth every 4 (four) hours as needed for severe pain (pain score 7-10).   potassium chloride  SA (KLOR-CON  M) 20 MEQ tablet TAKE 1 TABLET BY MOUTH DAILY   simvastatin  (ZOCOR ) 20 MG tablet TAKE 1 TABLET BY MOUTH DAILY   TRUE METRIX BLOOD GLUCOSE TEST test strip Use  with device to  check sugars twice daily   TRUEplus Lancets 33G MISC Use with device to check sugars twice daily   ciclopirox  (PENLAC ) 8 % solution Apply topically at bedtime. Apply over nail and surrounding skin. Apply daily over previous coat. After seven (7) days, may remove with alcohol and continue cycle. (Patient not taking: Reported on 02/07/2024)   ibuprofen  (ADVIL ) 800 MG tablet TAKE 1 TABLET BY MOUTH DAILY   No facility-administered medications prior to visit.    Review of Systems  Constitutional: Negative.   HENT: Negative.    Eyes: Negative.   Respiratory: Negative.  Negative for shortness of breath.   Cardiovascular: Negative.  Negative for chest pain.  Gastrointestinal: Negative.  Negative for abdominal pain, constipation and diarrhea.  Genitourinary: Negative.   Musculoskeletal:  Negative for joint pain and myalgias.  Skin: Negative.   Neurological: Negative.  Negative for dizziness and headaches.  Endo/Heme/Allergies: Negative.   All other systems reviewed and are negative.      Objective:   BP 120/70   Pulse 96   Ht 5' 6 (1.676 m)   Wt 188 lb 3.2 oz (85.4 kg)   SpO2 99%   BMI 30.38 kg/m   Vitals:   02/07/24 0933  BP: 120/70  Pulse: 96  Height: 5' 6 (1.676 m)  Weight: 188 lb 3.2 oz (85.4 kg)  SpO2: 99%  BMI (Calculated): 30.39    Physical Exam Vitals and nursing note reviewed.  Constitutional:      Appearance: Normal appearance. She is normal weight.  HENT:     Head: Normocephalic and atraumatic.     Nose: Nose normal.     Mouth/Throat:     Mouth: Mucous membranes are moist.  Eyes:     Extraocular Movements: Extraocular movements intact.     Conjunctiva/sclera: Conjunctivae normal.     Pupils: Pupils are equal, round, and reactive to light.  Cardiovascular:     Rate and Rhythm: Normal rate and regular rhythm.     Pulses: Normal pulses.     Heart sounds: Murmur heard.  Pulmonary:     Effort: Pulmonary effort is normal.     Breath sounds:  Normal breath sounds.  Abdominal:     General: Abdomen is flat. Bowel sounds are normal.     Palpations: Abdomen is soft.  Musculoskeletal:        General: Normal range of motion.     Cervical back: Normal range of motion.  Skin:    General: Skin is warm and dry.  Neurological:     General: No focal deficit present.     Mental Status: She is alert and oriented to person, place, and time.  Psychiatric:  Mood and Affect: Mood normal.        Behavior: Behavior normal.        Thought Content: Thought content normal.        Judgment: Judgment normal.      No results found for any visits on 02/07/24.  No results found for this or any previous visit (from the past 2160 hours).    Assessment & Plan:  Fasting lab work today Continue same medications  Problem List Items Addressed This Visit       Cardiovascular and Mediastinum   Hypertension, essential - Primary   Relevant Orders   CMP14+EGFR     Other   Prediabetes   Relevant Orders   CMP14+EGFR   Hemoglobin A1c   Hyperlipemia   Relevant Orders   Lipid Profile   Other Visit Diagnoses       Thyroid  disorder screening       Relevant Orders   TSH       Return in about 4 weeks (around 03/06/2024) for fasting lab work prior.   Total time spent: 25 minutes  [employer], NP  02/07/2024   This document may have been prepared by Dragon Voice Recognition software and as such may include unintentional dictation errors.

## 2024-02-08 ENCOUNTER — Ambulatory Visit: Payer: Self-pay | Admitting: Cardiology

## 2024-02-08 LAB — LIPID PANEL
Chol/HDL Ratio: 2.1 ratio (ref 0.0–4.4)
Cholesterol, Total: 130 mg/dL (ref 100–199)
HDL: 62 mg/dL (ref >39)
LDL Chol Calc (NIH): 57 mg/dL (ref 0–99)
Triglycerides: 50 mg/dL (ref 0–149)
VLDL Cholesterol Cal: 11 mg/dL (ref 5–40)

## 2024-02-08 LAB — CMP14+EGFR
ALT: 10 IU/L (ref 0–32)
AST: 14 IU/L (ref 0–40)
Albumin: 4.3 g/dL (ref 3.7–4.7)
Alkaline Phosphatase: 43 IU/L — ABNORMAL LOW (ref 44–121)
BUN/Creatinine Ratio: 22 (ref 12–28)
BUN: 22 mg/dL (ref 8–27)
Bilirubin Total: 0.8 mg/dL (ref 0.0–1.2)
CO2: 22 mmol/L (ref 20–29)
Calcium: 9.6 mg/dL (ref 8.7–10.3)
Chloride: 103 mmol/L (ref 96–106)
Creatinine, Ser: 1.02 mg/dL — ABNORMAL HIGH (ref 0.57–1.00)
Globulin, Total: 2.4 g/dL (ref 1.5–4.5)
Glucose: 98 mg/dL (ref 70–99)
Potassium: 3.8 mmol/L (ref 3.5–5.2)
Sodium: 142 mmol/L (ref 134–144)
Total Protein: 6.7 g/dL (ref 6.0–8.5)
eGFR: 55 mL/min/1.73 — ABNORMAL LOW (ref >59)

## 2024-02-08 LAB — HEMOGLOBIN A1C
Est. average glucose Bld gHb Est-mCnc: 137 mg/dL
Hgb A1c MFr Bld: 6.4 % — ABNORMAL HIGH (ref 4.8–5.6)

## 2024-02-08 LAB — TSH: TSH: 0.971 u[IU]/mL (ref 0.450–4.500)

## 2024-02-08 NOTE — Progress Notes (Signed)
 Called pt, unable to lvm

## 2024-02-10 NOTE — Progress Notes (Signed)
 Informed via daughter VM of stable labs

## 2024-02-14 ENCOUNTER — Ambulatory Visit (INDEPENDENT_AMBULATORY_CARE_PROVIDER_SITE_OTHER): Admitting: Cardiovascular Disease

## 2024-02-14 ENCOUNTER — Encounter: Payer: Self-pay | Admitting: Cardiovascular Disease

## 2024-02-14 VITALS — BP 117/72 | HR 50 | Ht 66.0 in | Wt 187.4 lb

## 2024-02-14 DIAGNOSIS — E782 Mixed hyperlipidemia: Secondary | ICD-10-CM

## 2024-02-14 DIAGNOSIS — I10 Essential (primary) hypertension: Secondary | ICD-10-CM | POA: Diagnosis not present

## 2024-02-14 DIAGNOSIS — I340 Nonrheumatic mitral (valve) insufficiency: Secondary | ICD-10-CM | POA: Diagnosis not present

## 2024-02-14 DIAGNOSIS — K2100 Gastro-esophageal reflux disease with esophagitis, without bleeding: Secondary | ICD-10-CM

## 2024-02-14 DIAGNOSIS — N1831 Chronic kidney disease, stage 3a: Secondary | ICD-10-CM

## 2024-02-14 DIAGNOSIS — I4891 Unspecified atrial fibrillation: Secondary | ICD-10-CM | POA: Diagnosis not present

## 2024-02-14 DIAGNOSIS — R0602 Shortness of breath: Secondary | ICD-10-CM | POA: Diagnosis not present

## 2024-02-14 DIAGNOSIS — I351 Nonrheumatic aortic (valve) insufficiency: Secondary | ICD-10-CM | POA: Diagnosis not present

## 2024-02-14 MED ORDER — LOSARTAN POTASSIUM 25 MG PO TABS: 25.0000 mg | ORAL_TABLET | Freq: Every day | ORAL | 1 refills | Status: AC

## 2024-02-14 NOTE — Progress Notes (Signed)
 Cardiology Office Note   Date:  02/14/2024   ID:  [name], [name] June 15, 1941, MRN [id number]  PCP:  Carin Gauze, NP  Cardiologist:  Denyse Bathe, MD      History of Present Illness: [name] [name] is a 83 y.o. female who presents for  Chief Complaint  Patient presents with   Follow-up    2 month follow up    Doing well, able to walk, no SOB or chest pains.      Past Medical History:  Diagnosis Date   GERD (gastroesophageal reflux disease)    Hypertension      History reviewed. No pertinent surgical history.   Current Outpatient Medications  Medication Sig Dispense Refill   Alcohol Swabs (DROPSAFE ALCOHOL PREP) 70 % PADS      amLODipine  (NORVASC ) 5 MG tablet TAKE 1 TABLET BY MOUTH DAILY 100 tablet 2   ammonium lactate  (AMLACTIN DAILY) 12 % lotion Apply 1 Application topically as needed. 400 g 0   apixaban  (ELIQUIS ) 5 MG TABS tablet Take 1 tablet (5 mg total) by mouth 2 (two) times daily. 60 tablet 2   Blood Glucose Monitoring Suppl (TRUE METRIX METER) w/Device KIT Use device to check sugars twice daily 1 kit 0   buPROPion (WELLBUTRIN XL) 150 MG 24 hr tablet Take 1 tablet by mouth daily.     chlorthalidone  (HYGROTON ) 25 MG tablet TAKE 1 TABLET BY MOUTH DAILY 100 tablet 2   Cholecalciferol (VITAMIN D3) 25 MCG (1000 UT) CAPS Take by mouth.     clotrimazole -betamethasone  (LOTRISONE ) cream Apply 1 Application topically daily. 30 g 0   clotrimazole -betamethasone  (LOTRISONE ) cream Apply 1 Application topically daily. 30 g 0   gabapentin  (NEURONTIN ) 400 MG capsule TAKE 1 CAPSULE BY MOUTH AT  BEDTIME 90 capsule 3   ibuprofen  (ADVIL ) 800 MG tablet Take 1 tablet (800 mg total) by mouth every 6 (six) hours as needed. 60 tablet 1   memantine  (NAMENDA ) 5 MG tablet TAKE 1 TABLET BY MOUTH DAILY 100 tablet 2   metFORMIN  (GLUCOPHAGE ) 500 MG tablet TAKE 1 TABLET BY MOUTH TWICE  DAILY WITH MEALS 200 tablet 2   omeprazole  (PRILOSEC) 40 MG capsule TAKE 1 CAPSULE BY MOUTH DAILY  100 capsule 2   oxyCODONE -acetaminophen  (PERCOCET) 5-325 MG tablet Take 1 tablet by mouth every 4 (four) hours as needed for severe pain (pain score 7-10). 30 tablet 0   potassium chloride  SA (KLOR-CON  M) 20 MEQ tablet TAKE 1 TABLET BY MOUTH DAILY 100 tablet 2   simvastatin  (ZOCOR ) 20 MG tablet TAKE 1 TABLET BY MOUTH DAILY 100 tablet 2   TRUE METRIX BLOOD GLUCOSE TEST test strip Use with device to  check sugars twice daily 100 each 3   TRUEplus Lancets 33G MISC Use with device to check sugars twice daily 100 each 3   losartan  (COZAAR ) 25 MG tablet Take 1 tablet (25 mg total) by mouth daily. 90 tablet 1   No current facility-administered medications for this visit.    Allergies:   Patient has no known allergies.    Social History:   reports that she has quit smoking. She has never used smokeless tobacco. She reports current alcohol use. She reports that she does not currently use drugs.   Family History:  family history is not on file.    ROS:     Review of Systems  Constitutional: Negative.   HENT: Negative.    Eyes: Negative.   Respiratory: Negative.    Gastrointestinal:  Negative.   Genitourinary: Negative.   Musculoskeletal: Negative.   Skin: Negative.   Neurological: Negative.   Endo/Heme/Allergies: Negative.   Psychiatric/Behavioral: Negative.    All other systems reviewed and are negative.     All other systems are reviewed and negative.    PHYSICAL EXAM: VS:  BP 117/72   Pulse (!) 50   Ht 5' 6 (1.676 m)   Wt 187 lb 6.4 oz (85 kg)   SpO2 96%   BMI 30.25 kg/m  , BMI Body mass index is 30.25 kg/m. Last weight:  Wt Readings from Last 3 Encounters:  02/14/24 187 lb 6.4 oz (85 kg)  02/07/24 188 lb 3.2 oz (85.4 kg)  12/06/23 184 lb 12.8 oz (83.8 kg)     Physical Exam Constitutional:      Appearance: Normal appearance.  Cardiovascular:     Rate and Rhythm: Normal rate and regular rhythm.     Heart sounds: Normal heart sounds.  Pulmonary:     Effort:  Pulmonary effort is normal.     Breath sounds: Normal breath sounds.  Musculoskeletal:     Right lower leg: No edema.     Left lower leg: No edema.  Neurological:     Mental Status: She is alert.       EKG:   Recent Labs: 10/06/2023: Hemoglobin 17.3; Platelets 285 02/07/2024: ALT 10; BUN 22; Creatinine, Ser 1.02; Potassium 3.8; Sodium 142; TSH 0.971    Lipid Panel    Component Value Date/Time   CHOL 130 02/07/2024 0952   TRIG 50 02/07/2024 0952   HDL 62 02/07/2024 0952   CHOLHDL 2.1 02/07/2024 0952   LDLCALC 57 02/07/2024 0952      Other studies Reviewed: Additional studies/ records that were reviewed today include:  Review of the above records demonstrates:       No data to display            ASSESSMENT AND PLAN:    ICD-10-CM   1. Atrial fibrillation, unspecified type (HCC)  I48.91 losartan  (COZAAR ) 25 MG tablet   in afib, but HR controlled about 65, asymptomatic on stroke prevention, elliquis.    2. Hypertension, essential  I10 losartan  (COZAAR ) 25 MG tablet    3. Gastroesophageal reflux disease with esophagitis without hemorrhage  K21.00     4. Mixed hyperlipidemia  E78.2 losartan  (COZAAR ) 25 MG tablet    5. Nonrheumatic aortic valve insufficiency  I35.1 losartan  (COZAAR ) 25 MG tablet    6. Nonrheumatic mitral valve regurgitation  I34.0 losartan  (COZAAR ) 25 MG tablet    7. SOB (shortness of breath)  R06.02 losartan  (COZAAR ) 25 MG tablet    8. Stage 3a chronic kidney disease (CKD) (HCC)  N18.31 losartan  (COZAAR ) 25 MG tablet    9. SOB (shortness of breath)  R06.02 losartan  (COZAAR ) 25 MG tablet   Creat 1.1, add losartan  25 as has Modereate MR, mild AR and LVEF borderline 50%. Has only DOE. Stress test had no ischaemia.    10. Nonrheumatic aortic valve insufficiency  I35.1 losartan  (COZAAR ) 25 MG tablet   Mild AR, mild AS LVEF 50%    11. Nonrheumatic mitral valve regurgitation  I34.0 losartan  (COZAAR ) 25 MG tablet   Moderate MR       Problem  List Items Addressed This Visit       Cardiovascular and Mediastinum   Hypertension, essential   Relevant Medications   losartan  (COZAAR ) 25 MG tablet     Digestive   GERD (gastroesophageal reflux  disease)     Genitourinary   Stage 3a chronic kidney disease (CKD) (HCC)   Relevant Medications   losartan  (COZAAR ) 25 MG tablet     Other   Hyperlipemia   Relevant Medications   losartan  (COZAAR ) 25 MG tablet   Other Visit Diagnoses       Atrial fibrillation, unspecified type (HCC)    -  Primary   in afib, but HR controlled about 65, asymptomatic on stroke prevention, elliquis.   Relevant Medications   losartan  (COZAAR ) 25 MG tablet     Nonrheumatic aortic valve insufficiency       Relevant Medications   losartan  (COZAAR ) 25 MG tablet     Nonrheumatic mitral valve regurgitation       Relevant Medications   losartan  (COZAAR ) 25 MG tablet     SOB (shortness of breath)       Relevant Medications   losartan  (COZAAR ) 25 MG tablet     SOB (shortness of breath)       Creat 1.1, add losartan  25 as has Modereate MR, mild AR and LVEF borderline 50%. Has only DOE. Stress test had no ischaemia.   Relevant Medications   losartan  (COZAAR ) 25 MG tablet     Nonrheumatic aortic valve insufficiency       Mild AR, mild AS LVEF 50%   Relevant Medications   losartan  (COZAAR ) 25 MG tablet     Nonrheumatic mitral valve regurgitation       Moderate MR   Relevant Medications   losartan  (COZAAR ) 25 MG tablet          Disposition:   Return in about 3 months (around 05/16/2024).    Total time spent: 30 minutes  Signed,  Denyse Bathe, MD  02/14/2024 11:27 AM    [facility]

## 2024-02-21 DIAGNOSIS — M170 Bilateral primary osteoarthritis of knee: Secondary | ICD-10-CM | POA: Diagnosis not present

## 2024-04-09 ENCOUNTER — Other Ambulatory Visit: Payer: Self-pay | Admitting: Cardiology

## 2024-05-10 ENCOUNTER — Ambulatory Visit (INDEPENDENT_AMBULATORY_CARE_PROVIDER_SITE_OTHER): Admitting: Podiatry

## 2024-05-10 DIAGNOSIS — M65971 Unspecified synovitis and tenosynovitis, right ankle and foot: Secondary | ICD-10-CM

## 2024-05-10 DIAGNOSIS — M2021 Hallux rigidus, right foot: Secondary | ICD-10-CM

## 2024-05-10 MED ORDER — CLOTRIMAZOLE-BETAMETHASONE 1-0.05 % EX CREA: 1.0000 | TOPICAL_CREAM | Freq: Every day | CUTANEOUS | 0 refills | Status: AC

## 2024-05-10 NOTE — Progress Notes (Signed)
 Subjective:  Patient ID: [name] [name], female    DOB: 04-Mar-1941,  MRN: [id number]  Chief Complaint  Patient presents with   Toe Pain    83 y.o. female presents with the above complaint..  Patient presents with new complaint of right first metatarsophalangeal joint pain has came out of nowhere hurts with ambulation and shoe pressure has not seen and was prior to seeing me.  She wanted discuss treatment options for it.  She notices that when she is going to start Her shoes gets more aggravated.   Review of Systems: Negative except as noted in the HPI. Denies N/V/F/Ch.  Past Medical History:  Diagnosis Date   GERD (gastroesophageal reflux disease)    Hypertension     Current Outpatient Medications:    clotrimazole -betamethasone  (LOTRISONE ) cream, Apply 1 Application topically daily., Disp: 30 g, Rfl: 0   Alcohol Swabs (DROPSAFE ALCOHOL PREP) 70 % PADS, , Disp: , Rfl:    amLODipine  (NORVASC ) 5 MG tablet, TAKE 1 TABLET BY MOUTH DAILY, Disp: 100 tablet, Rfl: 2   ammonium lactate  (AMLACTIN DAILY) 12 % lotion, Apply 1 Application topically as needed., Disp: 400 g, Rfl: 0   apixaban  (ELIQUIS ) 5 MG TABS tablet, Take 1 tablet (5 mg total) by mouth 2 (two) times daily., Disp: 60 tablet, Rfl: 2   Blood Glucose Monitoring Suppl (TRUE METRIX METER) w/Device KIT, Use device to check sugars twice daily, Disp: 1 kit, Rfl: 0   buPROPion (WELLBUTRIN XL) 150 MG 24 hr tablet, Take 1 tablet by mouth daily., Disp: , Rfl:    chlorthalidone  (HYGROTON ) 25 MG tablet, TAKE 1 TABLET BY MOUTH DAILY, Disp: 100 tablet, Rfl: 2   Cholecalciferol (VITAMIN D3) 25 MCG (1000 UT) CAPS, Take by mouth., Disp: , Rfl:    clotrimazole -betamethasone  (LOTRISONE ) cream, Apply 1 Application topically daily., Disp: 30 g, Rfl: 0   clotrimazole -betamethasone  (LOTRISONE ) cream, Apply 1 Application topically daily., Disp: 30 g, Rfl: 0   gabapentin  (NEURONTIN ) 400 MG capsule, TAKE 1 CAPSULE BY MOUTH AT  BEDTIME, Disp: 90 capsule, Rfl:  3   ibuprofen  (ADVIL ) 800 MG tablet, TAKE 1 TABLET BY MOUTH DAILY, Disp: 100 tablet, Rfl: 2   losartan  (COZAAR ) 25 MG tablet, Take 1 tablet (25 mg total) by mouth daily., Disp: 90 tablet, Rfl: 1   memantine  (NAMENDA ) 5 MG tablet, TAKE 1 TABLET BY MOUTH DAILY, Disp: 100 tablet, Rfl: 2   metFORMIN  (GLUCOPHAGE ) 500 MG tablet, TAKE 1 TABLET BY MOUTH TWICE  DAILY WITH MEALS, Disp: 200 tablet, Rfl: 2   metoprolol  succinate (TOPROL  XL) 25 MG 24 hr tablet, Take 0.5 tablets (12.5 mg total) by mouth daily., Disp: 15 tablet, Rfl: 11   omeprazole  (PRILOSEC) 40 MG capsule, TAKE 1 CAPSULE BY MOUTH DAILY, Disp: 100 capsule, Rfl: 2   oxyCODONE -acetaminophen  (PERCOCET) 5-325 MG tablet, Take 1 tablet by mouth every 4 (four) hours as needed for severe pain (pain score 7-10)., Disp: 30 tablet, Rfl: 0   potassium chloride  SA (KLOR-CON  M) 20 MEQ tablet, TAKE 1 TABLET BY MOUTH DAILY, Disp: 100 tablet, Rfl: 2   simvastatin  (ZOCOR ) 20 MG tablet, TAKE 1 TABLET BY MOUTH DAILY, Disp: 100 tablet, Rfl: 2   TRUE METRIX BLOOD GLUCOSE TEST test strip, Use with device to  check sugars twice daily, Disp: 100 each, Rfl: 3   TRUEplus Lancets 33G MISC, Use with device to check sugars twice daily, Disp: 100 each, Rfl: 3  Social History   Tobacco Use  Smoking Status Former  Smokeless Tobacco  Never    No Known Allergies Objective:  There were no vitals filed for this visit. There is no height or weight on file to calculate BMI. Constitutional Well developed. Well nourished.  Vascular Dorsalis pedis pulses palpable bilaterally. Posterior tibial pulses palpable bilaterally. Capillary refill normal to all digits.  No cyanosis or clubbing noted. Pedal hair growth normal.  Neurologic Normal speech. Oriented to person, place, and time. Epicritic sensation to light touch grossly present bilaterally.  Dermatologic Nails well groomed and normal in appearance. No open wounds. No skin lesions.  Orthopedic: Pain on palpation of  right first metatarsophalangeal joint Limited range of motion of the right first MTPJ joint.  Some crepitus noted.  Pain at the sesamoidal complex no extensor flexor tendinitis noted   Radiographs: None Assessment:   1. Hallux rigidus, right foot   2. Synovitis of right foot    Plan:  Patient was evaluated and treated and all questions answered.  Right first metatarsophalangeal joint hallux rigidus noted with underlying synovitis - All questions or concerns were discussed with the patient in extensive detail - Given the amount of pain that she is experiencing she would benefit from steroid injection I will decrease inflammatory, surgical pain.  Patient agrees with plan like to proceed with steroid injection -A steroid injection was performed at right first MTP using 1% plain Lidocaine and 10 mg of Kenalog. This was well tolerated.   No follow-ups on file.

## 2024-05-14 ENCOUNTER — Ambulatory Visit (INDEPENDENT_AMBULATORY_CARE_PROVIDER_SITE_OTHER): Admitting: Cardiovascular Disease

## 2024-05-14 ENCOUNTER — Encounter: Payer: Self-pay | Admitting: Cardiovascular Disease

## 2024-05-14 VITALS — BP 122/80 | HR 97 | Ht 66.0 in | Wt 184.2 lb

## 2024-05-14 DIAGNOSIS — N1831 Chronic kidney disease, stage 3a: Secondary | ICD-10-CM

## 2024-05-14 DIAGNOSIS — E663 Overweight: Secondary | ICD-10-CM

## 2024-05-14 DIAGNOSIS — E782 Mixed hyperlipidemia: Secondary | ICD-10-CM | POA: Diagnosis not present

## 2024-05-14 DIAGNOSIS — R0602 Shortness of breath: Secondary | ICD-10-CM | POA: Diagnosis not present

## 2024-05-14 DIAGNOSIS — I4891 Unspecified atrial fibrillation: Secondary | ICD-10-CM | POA: Diagnosis not present

## 2024-05-14 DIAGNOSIS — I10 Essential (primary) hypertension: Secondary | ICD-10-CM

## 2024-05-14 DIAGNOSIS — I340 Nonrheumatic mitral (valve) insufficiency: Secondary | ICD-10-CM

## 2024-05-14 DIAGNOSIS — I351 Nonrheumatic aortic (valve) insufficiency: Secondary | ICD-10-CM

## 2024-05-14 MED ORDER — METOPROLOL SUCCINATE ER 25 MG PO TB24: 12.5000 mg | ORAL_TABLET | Freq: Every day | ORAL | 11 refills | Status: AC

## 2024-05-14 NOTE — Progress Notes (Signed)
 Cardiology Office Note   Date:  05/14/2024   ID:  Acire, Tang 1941/05/14, MRN [id number]  PCP:  Carin Gauze, NP  Cardiologist:  Denyse Bathe, MD      History of Present Illness: [name] [name] is a 83 y.o. female who presents for  Chief Complaint  Patient presents with   Follow-up    3 month follow up    No SOB, No chest pain.      Past Medical History:  Diagnosis Date   GERD (gastroesophageal reflux disease)    Hypertension      No past surgical history on file.   Current Outpatient Medications  Medication Sig Dispense Refill   Alcohol Swabs (DROPSAFE ALCOHOL PREP) 70 % PADS      amLODipine  (NORVASC ) 5 MG tablet TAKE 1 TABLET BY MOUTH DAILY 100 tablet 2   ammonium lactate  (AMLACTIN DAILY) 12 % lotion Apply 1 Application topically as needed. 400 g 0   apixaban  (ELIQUIS ) 5 MG TABS tablet Take 1 tablet (5 mg total) by mouth 2 (two) times daily. 60 tablet 2   Blood Glucose Monitoring Suppl (TRUE METRIX METER) w/Device KIT Use device to check sugars twice daily 1 kit 0   buPROPion (WELLBUTRIN XL) 150 MG 24 hr tablet Take 1 tablet by mouth daily.     chlorthalidone  (HYGROTON ) 25 MG tablet TAKE 1 TABLET BY MOUTH DAILY 100 tablet 2   Cholecalciferol (VITAMIN D3) 25 MCG (1000 UT) CAPS Take by mouth.     clotrimazole -betamethasone  (LOTRISONE ) cream Apply 1 Application topically daily. 30 g 0   clotrimazole -betamethasone  (LOTRISONE ) cream Apply 1 Application topically daily. 30 g 0   clotrimazole -betamethasone  (LOTRISONE ) cream Apply 1 Application topically daily. 30 g 0   gabapentin  (NEURONTIN ) 400 MG capsule TAKE 1 CAPSULE BY MOUTH AT  BEDTIME 90 capsule 3   ibuprofen  (ADVIL ) 800 MG tablet TAKE 1 TABLET BY MOUTH DAILY 100 tablet 2   losartan  (COZAAR ) 25 MG tablet Take 1 tablet (25 mg total) by mouth daily. 90 tablet 1   memantine  (NAMENDA ) 5 MG tablet TAKE 1 TABLET BY MOUTH DAILY 100 tablet 2   metFORMIN  (GLUCOPHAGE ) 500 MG tablet TAKE 1 TABLET BY MOUTH TWICE   DAILY WITH MEALS 200 tablet 2   metoprolol  succinate (TOPROL  XL) 25 MG 24 hr tablet Take 0.5 tablets (12.5 mg total) by mouth daily. 15 tablet 11   omeprazole  (PRILOSEC) 40 MG capsule TAKE 1 CAPSULE BY MOUTH DAILY 100 capsule 2   oxyCODONE -acetaminophen  (PERCOCET) 5-325 MG tablet Take 1 tablet by mouth every 4 (four) hours as needed for severe pain (pain score 7-10). 30 tablet 0   potassium chloride  SA (KLOR-CON  M) 20 MEQ tablet TAKE 1 TABLET BY MOUTH DAILY 100 tablet 2   simvastatin  (ZOCOR ) 20 MG tablet TAKE 1 TABLET BY MOUTH DAILY 100 tablet 2   TRUE METRIX BLOOD GLUCOSE TEST test strip Use with device to  check sugars twice daily 100 each 3   TRUEplus Lancets 33G MISC Use with device to check sugars twice daily 100 each 3   No current facility-administered medications for this visit.    Allergies:   Patient has no known allergies.    Social History:   reports that she has quit smoking. She has never used smokeless tobacco. She reports current alcohol use. She reports that she does not currently use drugs.   Family History:  family history is not on file.    ROS:  Review of Systems  Constitutional: Negative.   HENT: Negative.    Eyes: Negative.   Respiratory: Negative.    Gastrointestinal: Negative.   Genitourinary: Negative.   Musculoskeletal: Negative.   Skin: Negative.   Neurological: Negative.   Endo/Heme/Allergies: Negative.   Psychiatric/Behavioral: Negative.    All other systems reviewed and are negative.     All other systems are reviewed and negative.    PHYSICAL EXAM: VS:  BP 122/80   Pulse 97   Ht 5' 6 (1.676 m)   Wt 184 lb 3.2 oz (83.6 kg)   SpO2 96%   BMI 29.73 kg/m  , BMI Body mass index is 29.73 kg/m. Last weight:  Wt Readings from Last 3 Encounters:  05/14/24 184 lb 3.2 oz (83.6 kg)  02/14/24 187 lb 6.4 oz (85 kg)  02/07/24 188 lb 3.2 oz (85.4 kg)     Physical Exam Constitutional:      Appearance: Normal appearance.  Cardiovascular:      Rate and Rhythm: Normal rate and regular rhythm.     Heart sounds: Normal heart sounds.  Pulmonary:     Effort: Pulmonary effort is normal.     Breath sounds: Normal breath sounds.  Musculoskeletal:     Right lower leg: No edema.     Left lower leg: No edema.  Neurological:     Mental Status: She is alert.       EKG:   Recent Labs: 10/06/2023: Hemoglobin 17.3; Platelets 285 02/07/2024: ALT 10; BUN 22; Creatinine, Ser 1.02; Potassium 3.8; Sodium 142; TSH 0.971    Lipid Panel    Component Value Date/Time   CHOL 130 02/07/2024 0952   TRIG 50 02/07/2024 0952   HDL 62 02/07/2024 0952   CHOLHDL 2.1 02/07/2024 0952   LDLCALC 57 02/07/2024 0952      Other studies Reviewed: Additional studies/ records that were reviewed today include:  Review of the above records demonstrates:       No data to display            ASSESSMENT AND PLAN:    ICD-10-CM   1. Stage 3a chronic kidney disease (CKD) (HCC)  N18.31 metoprolol  succinate (TOPROL  XL) 25 MG 24 hr tablet    2. Atrial fibrillation, unspecified type (HCC)  I48.91 metoprolol  succinate (TOPROL  XL) 25 MG 24 hr tablet   Rate controll as still in afib, add metoprolol .    3. SOB (shortness of breath)  R06.02 metoprolol  succinate (TOPROL  XL) 25 MG 24 hr tablet    4. Nonrheumatic mitral valve regurgitation  I34.0 metoprolol  succinate (TOPROL  XL) 25 MG 24 hr tablet    5. Nonrheumatic aortic valve insufficiency  I35.1 metoprolol  succinate (TOPROL  XL) 25 MG 24 hr tablet    6. Mixed hyperlipidemia  E78.2 metoprolol  succinate (TOPROL  XL) 25 MG 24 hr tablet    7. Overweight  E66.3 metoprolol  succinate (TOPROL  XL) 25 MG 24 hr tablet    8. Hypertension, essential  I10 metoprolol  succinate (TOPROL  XL) 25 MG 24 hr tablet       Problem List Items Addressed This Visit       Cardiovascular and Mediastinum   Hypertension, essential   Relevant Medications   metoprolol  succinate (TOPROL  XL) 25 MG 24 hr tablet      Genitourinary   Stage 3a chronic kidney disease (CKD) (HCC) - Primary   Relevant Medications   metoprolol  succinate (TOPROL  XL) 25 MG 24 hr tablet     Other   Overweight  Relevant Medications   metoprolol  succinate (TOPROL  XL) 25 MG 24 hr tablet   Hyperlipemia   Relevant Medications   metoprolol  succinate (TOPROL  XL) 25 MG 24 hr tablet   Other Visit Diagnoses       Atrial fibrillation, unspecified type (HCC)       Rate controll as still in afib, add metoprolol .   Relevant Medications   metoprolol  succinate (TOPROL  XL) 25 MG 24 hr tablet     SOB (shortness of breath)       Relevant Medications   metoprolol  succinate (TOPROL  XL) 25 MG 24 hr tablet     Nonrheumatic mitral valve regurgitation       Relevant Medications   metoprolol  succinate (TOPROL  XL) 25 MG 24 hr tablet     Nonrheumatic aortic valve insufficiency       Relevant Medications   metoprolol  succinate (TOPROL  XL) 25 MG 24 hr tablet          Disposition:   No follow-ups on file.    Total time spent: 40 minutes  Signed,  Denyse Bathe, MD  05/14/2024 10:47 AM    [facility]

## 2024-06-07 ENCOUNTER — Ambulatory Visit: Admitting: Cardiology

## 2024-06-08 ENCOUNTER — Ambulatory Visit: Admitting: Cardiology

## 2024-06-12 ENCOUNTER — Ambulatory Visit (INDEPENDENT_AMBULATORY_CARE_PROVIDER_SITE_OTHER): Admitting: Cardiology

## 2024-06-12 ENCOUNTER — Encounter: Payer: Self-pay | Admitting: Cardiology

## 2024-06-12 VITALS — BP 114/72 | HR 109 | Ht 66.0 in | Wt 180.4 lb

## 2024-06-12 DIAGNOSIS — R7303 Prediabetes: Secondary | ICD-10-CM | POA: Diagnosis not present

## 2024-06-12 DIAGNOSIS — E1169 Type 2 diabetes mellitus with other specified complication: Secondary | ICD-10-CM

## 2024-06-12 DIAGNOSIS — I152 Hypertension secondary to endocrine disorders: Secondary | ICD-10-CM

## 2024-06-12 DIAGNOSIS — I10 Essential (primary) hypertension: Secondary | ICD-10-CM | POA: Diagnosis not present

## 2024-06-12 DIAGNOSIS — E1159 Type 2 diabetes mellitus with other circulatory complications: Secondary | ICD-10-CM | POA: Diagnosis not present

## 2024-06-12 DIAGNOSIS — E1165 Type 2 diabetes mellitus with hyperglycemia: Secondary | ICD-10-CM | POA: Diagnosis not present

## 2024-06-12 DIAGNOSIS — E782 Mixed hyperlipidemia: Secondary | ICD-10-CM | POA: Diagnosis not present

## 2024-06-12 DIAGNOSIS — Z1329 Encounter for screening for other suspected endocrine disorder: Secondary | ICD-10-CM | POA: Diagnosis not present

## 2024-06-12 DIAGNOSIS — N1831 Chronic kidney disease, stage 3a: Secondary | ICD-10-CM | POA: Diagnosis not present

## 2024-06-12 NOTE — Progress Notes (Signed)
 "  Established Patient Office Visit  Subjective:  Patient ID: [name] [name], female    DOB: [birth year]  Age: 83 y.o. MRN: [id number]  Chief Complaint  Patient presents with   Follow-up    4 month follow up with lab results    Patient in office for 4 month follow up, discuss recent lab results. Did not have lab work done. Fasting today.  Patient doing well, no complaints today. Blood pressure well controlled.  Continue same medications.     No other concerns at this time.   Past Medical History:  Diagnosis Date   GERD (gastroesophageal reflux disease)    Hypertension     History reviewed. No pertinent surgical history.  Social History   Socioeconomic History   Marital status: Married    Spouse name: Not on file   Number of children: Not on file   Years of education: Not on file   Highest education level: Not on file  Occupational History   Not on file  Tobacco Use   Smoking status: Former   Smokeless tobacco: Never  Substance and Sexual Activity   Alcohol use: Yes    Comment: occ   Drug use: Not Currently   Sexual activity: Not Currently  Other Topics Concern   Not on file  Social History Narrative   Not on file   Social Drivers of Health   Tobacco Use: Medium Risk (06/12/2024)   Patient History    Smoking Tobacco Use: Former    Smokeless Tobacco Use: Never    Passive Exposure: Not on file  Financial Resource Strain: Low Risk  (12/28/2023)   Received from [facility]   Overall Financial Resource Strain (CARDIA)    Difficulty of Paying Living Expenses: Not very hard  Food Insecurity: No Food Insecurity (12/28/2023)   Received from [facility]   Epic    Within the past 12 months, you worried that your food would run out before you got the money to buy more.: Never true    Within the past 12 months, the food you bought just didn't last and you didn't have money to get more.: Never true  Transportation Needs: No  Transportation Needs (12/28/2023)   Received from [facility] - Transportation    In the past 12 months, has lack of transportation kept you from medical appointments or from getting medications?: No    Lack of Transportation (Non-Medical): No  Physical Activity: Not on file  Stress: Not on file  Social Connections: Not on file  Intimate Partner Violence: Not on file  Depression (EYV7-0): Not on file  Alcohol Screen: Not on file  Housing: Low Risk  (12/28/2023)   Received from [facility]   Epic    In the last 12 months, was there a time when you were not able to pay the mortgage or rent on time?: No    In the past 12 months, how many times have you moved where you were living?: 0    At any time in the past 12 months, were you homeless or living in a shelter (including now)?: No  Utilities: Not At Risk (12/28/2023)   Received from [facility]   Epic    In the past 12 months has the electric, gas, oil, or water company threatened to shut off services in your home?: No  Health Literacy: Not on file    Family History  Problem  Relation Age of Onset   Breast cancer Neg Hx     Allergies[1]  Show/hide medication list[2]  Review of Systems  Constitutional: Negative.   HENT: Negative.    Eyes: Negative.   Respiratory: Negative.  Negative for shortness of breath.   Cardiovascular: Negative.  Negative for chest pain.  Gastrointestinal: Negative.  Negative for abdominal pain, constipation and diarrhea.  Genitourinary: Negative.   Musculoskeletal:  Negative for joint pain and myalgias.  Skin: Negative.   Neurological: Negative.  Negative for dizziness and headaches.  Endo/Heme/Allergies: Negative.   All other systems reviewed and are negative.      Objective:   BP 114/72   Pulse (!) 109   Ht 5' 6 (1.676 m)   Wt 180 lb 6.4 oz (81.8 kg)   SpO2 93%   BMI 29.12 kg/m   Vitals:   06/12/24 0924  BP: 114/72  Pulse:  (!) 109  Height: 5' 6 (1.676 m)  Weight: 180 lb 6.4 oz (81.8 kg)  SpO2: 93%  BMI (Calculated): 29.13    Physical Exam Vitals and nursing note reviewed.  Constitutional:      Appearance: Normal appearance. She is normal weight.  HENT:     Head: Normocephalic and atraumatic.     Nose: Nose normal.     Mouth/Throat:     Mouth: Mucous membranes are moist.  Eyes:     Extraocular Movements: Extraocular movements intact.     Conjunctiva/sclera: Conjunctivae normal.     Pupils: Pupils are equal, round, and reactive to light.  Cardiovascular:     Rate and Rhythm: Normal rate and regular rhythm.     Pulses: Normal pulses.     Heart sounds: Normal heart sounds.  Pulmonary:     Effort: Pulmonary effort is normal.     Breath sounds: Normal breath sounds.  Abdominal:     General: Abdomen is flat. Bowel sounds are normal.     Palpations: Abdomen is soft.  Musculoskeletal:        General: Normal range of motion.     Cervical back: Normal range of motion.  Skin:    General: Skin is warm and dry.  Neurological:     General: No focal deficit present.     Mental Status: She is alert and oriented to person, place, and time.  Psychiatric:        Mood and Affect: Mood normal.        Behavior: Behavior normal.        Thought Content: Thought content normal.        Judgment: Judgment normal.      No results found for any visits on 06/12/24.  No results found for this or any previous visit (from the past 2160 hours).    Assessment & Plan:  Fasting lab work today Continue current medications  Problem List Items Addressed This Visit       Cardiovascular and Mediastinum   Hypertension associated with diabetes (HCC) - Primary     Endocrine   Combined hyperlipidemia associated with type 2 diabetes mellitus (HCC)   Type 2 diabetes mellitus with hyperglycemia, without Stanek-term current use of insulin (HCC)     Genitourinary   Stage 3a chronic kidney disease (CKD) (HCC)   Relevant  Orders   CMP14+EGFR     Other   RESOLVED: Prediabetes   Relevant Orders   Hemoglobin A1c   RESOLVED: Hyperlipemia   Relevant Orders   TSH   Other Visit Diagnoses  Thyroid  disorder screening       Relevant Orders   Lipid Profile       Return in about 4 months (around 10/11/2024) for fasting lab work prior.   Total time spent: 25 minutes. This time includes review of previous notes and results and patient face to face interaction during today's visit.    Jeoffrey Pollen, NP  06/12/2024   This document may have been prepared by [facility] Voice Recognition software and as such may include unintentional dictation errors.     [1] No Known Allergies [2]  Outpatient Medications Prior to Visit  Medication Sig   Alcohol Swabs (DROPSAFE ALCOHOL PREP) 70 % PADS    amLODipine  (NORVASC ) 5 MG tablet TAKE 1 TABLET BY MOUTH DAILY   ammonium lactate  (AMLACTIN DAILY) 12 % lotion Apply 1 Application topically as needed.   apixaban  (ELIQUIS ) 5 MG TABS tablet Take 1 tablet (5 mg total) by mouth 2 (two) times daily.   Blood Glucose Monitoring Suppl (TRUE METRIX METER) w/Device KIT Use device to check sugars twice daily   buPROPion (WELLBUTRIN XL) 150 MG 24 hr tablet Take 1 tablet by mouth daily.   chlorthalidone  (HYGROTON ) 25 MG tablet TAKE 1 TABLET BY MOUTH DAILY   Cholecalciferol (VITAMIN D3) 25 MCG (1000 UT) CAPS Take by mouth.   clotrimazole -betamethasone  (LOTRISONE ) cream Apply 1 Application topically daily.   gabapentin  (NEURONTIN ) 400 MG capsule TAKE 1 CAPSULE BY MOUTH AT  BEDTIME   ibuprofen  (ADVIL ) 800 MG tablet TAKE 1 TABLET BY MOUTH DAILY   losartan  (COZAAR ) 25 MG tablet Take 1 tablet (25 mg total) by mouth daily.   memantine  (NAMENDA ) 5 MG tablet TAKE 1 TABLET BY MOUTH DAILY   metFORMIN  (GLUCOPHAGE ) 500 MG tablet TAKE 1 TABLET BY MOUTH TWICE  DAILY WITH MEALS   metoprolol  succinate (TOPROL  XL) 25 MG 24 hr tablet Take 0.5 tablets (12.5 mg total) by mouth daily.   omeprazole   (PRILOSEC) 40 MG capsule TAKE 1 CAPSULE BY MOUTH DAILY   oxyCODONE -acetaminophen  (PERCOCET) 5-325 MG tablet Take 1 tablet by mouth every 4 (four) hours as needed for severe pain (pain score 7-10).   potassium chloride  SA (KLOR-CON  M) 20 MEQ tablet TAKE 1 TABLET BY MOUTH DAILY   simvastatin  (ZOCOR ) 20 MG tablet TAKE 1 TABLET BY MOUTH DAILY   TRUE METRIX BLOOD GLUCOSE TEST test strip Use with device to  check sugars twice daily   TRUEplus Lancets 33G MISC Use with device to check sugars twice daily   [DISCONTINUED] clotrimazole -betamethasone  (LOTRISONE ) cream Apply 1 Application topically daily.   [DISCONTINUED] clotrimazole -betamethasone  (LOTRISONE ) cream Apply 1 Application topically daily.   No facility-administered medications prior to visit.   "

## 2024-06-16 LAB — LIPID PANEL

## 2024-06-16 LAB — HEMOGLOBIN A1C
Est. average glucose Bld gHb Est-mCnc: 131 mg/dL
Hgb A1c MFr Bld: 6.2 % — ABNORMAL HIGH (ref 4.8–5.6)

## 2024-06-16 LAB — CMP14+EGFR

## 2024-06-16 LAB — TSH

## 2024-06-19 ENCOUNTER — Ambulatory Visit: Payer: Self-pay | Admitting: Cardiology

## 2024-06-20 NOTE — Progress Notes (Signed)
 Pt informed

## 2024-07-15 ENCOUNTER — Other Ambulatory Visit: Payer: Self-pay | Admitting: Cardiology

## 2024-08-13 ENCOUNTER — Ambulatory Visit: Admitting: Cardiovascular Disease

## 2024-10-18 ENCOUNTER — Ambulatory Visit: Admitting: Cardiology
